# Patient Record
Sex: Female | Born: 1975 | Race: White | Hispanic: No | Marital: Married | Smoking: Never smoker
Health system: Southern US, Community
[De-identification: ages and names within clinical notes are randomized; demographics above are authoritative.]

## PROBLEM LIST (undated history)

## (undated) ENCOUNTER — Emergency Department (HOSPITAL_COMMUNITY): Admission: EM | Payer: BC Managed Care – PPO | Source: Home / Self Care

## (undated) DIAGNOSIS — R740 Nonspecific elevation of levels of transaminase and lactic acid dehydrogenase [LDH]: Secondary | ICD-10-CM

## (undated) DIAGNOSIS — R7401 Elevation of levels of liver transaminase levels: Secondary | ICD-10-CM

## (undated) DIAGNOSIS — E559 Vitamin D deficiency, unspecified: Secondary | ICD-10-CM

## (undated) DIAGNOSIS — R7402 Elevation of levels of lactic acid dehydrogenase (LDH): Secondary | ICD-10-CM

## (undated) DIAGNOSIS — E663 Overweight: Secondary | ICD-10-CM

## (undated) DIAGNOSIS — F419 Anxiety disorder, unspecified: Secondary | ICD-10-CM

## (undated) DIAGNOSIS — G8929 Other chronic pain: Secondary | ICD-10-CM

## (undated) DIAGNOSIS — L719 Rosacea, unspecified: Secondary | ICD-10-CM

## (undated) DIAGNOSIS — K760 Fatty (change of) liver, not elsewhere classified: Secondary | ICD-10-CM

## (undated) HISTORY — DX: Vitamin D deficiency, unspecified: E55.9

## (undated) HISTORY — DX: Rosacea, unspecified: L71.9

## (undated) HISTORY — DX: Anxiety disorder, unspecified: F41.9

## (undated) HISTORY — DX: Overweight: E66.3

## (undated) HISTORY — DX: Nonspecific elevation of levels of transaminase and lactic acid dehydrogenase (ldh): R74.0

## (undated) HISTORY — DX: Fatty (change of) liver, not elsewhere classified: K76.0

## (undated) HISTORY — DX: Elevation of levels of lactic acid dehydrogenase (LDH): R74.02

## (undated) HISTORY — DX: Other chronic pain: G89.29

## (undated) HISTORY — PX: WISDOM TOOTH EXTRACTION: SHX21

## (undated) HISTORY — DX: Elevation of levels of liver transaminase levels: R74.01

## (undated) HISTORY — PX: CHOLECYSTECTOMY: SHX55

---

## 1998-10-26 ENCOUNTER — Other Ambulatory Visit: Admission: RE | Admit: 1998-10-26 | Discharge: 1998-10-26 | Payer: Self-pay | Admitting: Gynecology

## 2007-10-14 ENCOUNTER — Ambulatory Visit (HOSPITAL_BASED_OUTPATIENT_CLINIC_OR_DEPARTMENT_OTHER): Admission: RE | Admit: 2007-10-14 | Discharge: 2007-10-14 | Payer: Self-pay | Admitting: Family Medicine

## 2010-02-18 ENCOUNTER — Ambulatory Visit: Payer: Self-pay | Admitting: Family Medicine

## 2010-02-18 DIAGNOSIS — J029 Acute pharyngitis, unspecified: Secondary | ICD-10-CM | POA: Insufficient documentation

## 2010-02-18 DIAGNOSIS — J069 Acute upper respiratory infection, unspecified: Secondary | ICD-10-CM | POA: Insufficient documentation

## 2010-02-18 DIAGNOSIS — F329 Major depressive disorder, single episode, unspecified: Secondary | ICD-10-CM | POA: Insufficient documentation

## 2010-02-20 ENCOUNTER — Telehealth (INDEPENDENT_AMBULATORY_CARE_PROVIDER_SITE_OTHER): Payer: Self-pay | Admitting: *Deleted

## 2010-05-22 NOTE — Progress Notes (Signed)
  Phone Note Outgoing Call Call back at [facility] Phone [phone]   Call placed by: Lajean Saver RN,  February 20, 2010 2:48 PM Call placed to: Patient Summary of Call: Callback: No answer, message left on patient's personal cell phone with reason for call and negative throat culture result. Call back with any questions.

## 2010-05-22 NOTE — Assessment & Plan Note (Signed)
Summary: runny nose/cough-drk brown-yellowis, HA x 1 wk rm 3   Vital Signs:  Patient Profile:   35 Years Old Female CC:      Cold & URI symptoms Height:     69 inches Weight:      170 pounds O2 Sat:      100 % O2 treatment:    Room Air Temp:     97.9 degrees F oral Pulse rate:   83 / minute Pulse rhythm:   regular Resp:     16 per minute BP sitting:   116 / 76  (left arm) Cuff size:   regular  Vitals Entered By: Areta Haber CMA (February 18, 2010 4:57 PM)                  Current Allergies: No known allergies History of Present Illness Chief Complaint: Cold & URI symptoms History of Present Illness:  Subjective: Patient complains of mild sore throat and nasal congestion for about 6 days.  Her two children have had similar symptoms.  Minimal cough No pleuritic pain No wheezing ? post-nasal drainage No sinus pain/pressure No itchy/red eyes No earache but ears feel clogged No hemoptysis No SOB No fever/chills No nausea No vomiting No abdominal pain No diarrhea No skin rashes + fatigue No myalgias + headache Used OTC meds without relief   Current Problems: ACUTE PHARYNGITIS (ICD-462) UPPER RESPIRATORY INFECTION, VIRAL (ICD-465.9) FAMILY HISTORY DIABETES 1ST DEGREE RELATIVE (ICD-V18.0) FAMILY HISTORY OF COLON CA 1ST DEGREE RELATIVE <60 (ICD-V16.0) FAMILY HISTORY BREAST CANCER 1ST DEGREE RELATIVE <50 (ICD-V16.3) DEPRESSION (ICD-311)   Current Meds NYQUIL 60-7.09-18-998 MG/30ML LIQD (PSEUDOEPH-DOXYLAMINE-DM-APAP) as directed DAYQUIL MULTI-SYMPTOM 30-325-10 MG/15ML LIQD (PSEUDOEPHEDRINE-APAP-DM) as directed CYMBALTA 30 MG CPEP (DULOXETINE HCL) 1 tab by mouth once daily AZITHROMYCIN 250 MG TABS (AZITHROMYCIN) Two tabs by mouth on day 1, then 1 tab daily on days 2 through 5 (Rx void after 02/27/10)  REVIEW OF SYSTEMS Constitutional Symptoms      Denies fever, chills, night sweats, weight loss, weight gain, and fatigue.  Eyes       Denies change in  vision, eye pain, eye discharge, glasses, contact lenses, and eye surgery. Ear/Nose/Throat/Mouth       Complains of frequent runny nose and sinus problems.      Denies hearing loss/aids, change in hearing, ear pain, ear discharge, dizziness, frequent nose bleeds, sore throat, hoarseness, and tooth pain or bleeding.      Comments: dark brown-yellowish x 1wk Respiratory       Complains of productive cough.      Denies dry cough, wheezing, shortness of breath, asthma, bronchitis, and emphysema/COPD.  Cardiovascular       Denies murmurs, chest pain, and tires easily with exhertion.    Gastrointestinal       Denies stomach pain, nausea/vomiting, diarrhea, constipation, blood in bowel movements, and indigestion. Genitourniary       Denies painful urination, kidney stones, and loss of urinary control. Neurological       Complains of headaches.      Denies paralysis, seizures, and fainting/blackouts. Musculoskeletal       Denies muscle pain, joint pain, joint stiffness, decreased range of motion, redness, swelling, muscle weakness, and gout.  Skin       Denies bruising, unusual mles/lumps or sores, and hair/skin or nail changes.  Psych       Denies mood changes, temper/anger issues, anxiety/stress, speech problems, depression, and sleep problems. Other Comments: cough-drk brown-yellowish. Pt has not seen her PCP  for this.   Past History:  Social History: Last updated: 02/18/2010 Never Smoked Alcohol use-no Drug use-no Married Regular exercise-yes  Risk Factors: Smoking Status: never (02/18/2010)  Past Medical History: Depression  Past Surgical History: Caesarean section Cholecystectomy  Family History: Family History Breast cancer 1st degree relative <50 Family History of Colon CA 1st degree relative <60 Family History Diabetes 1st degree relative  Social History: Never Smoked Alcohol use-no Drug use-no Married Regular exercise-yes Smoking Status:  never Drug Use:   no Does Patient Exercise:  yes   Objective:  Appearance:  Patient appears healthy, stated age, and in no acute distress  Ears:  Canals normal.  Tympanic membranes normal.   Eyes:  Pupils are equal, round, and reactive to light and accomdation.  Extraocular movement is intact.  Conjunctivae are not inflamed.  Nose:  Normal septum.  Normal turbinates, mildly congested.   No sinus tenderness present.  Pharynx:  Normal  Neck:  Supple.  Slightly tender shotty anterior/posterior nodes are palpated bilaterally.  Lungs:  Clear to auscultation.  Breath sounds are equal.  Heart:  Regular rate and rhythm without murmurs, rubs, or gallops.  Abdomen:  Nontender without masses or hepatosplenomegaly.  Bowel sounds are present.  No CVA or flank tenderness.  Extremities:  No edema.  Skin:  No rash Rapid strep test negative  Assessment New Problems: ACUTE PHARYNGITIS (ICD-462) UPPER RESPIRATORY INFECTION, VIRAL (ICD-465.9) FAMILY HISTORY DIABETES 1ST DEGREE RELATIVE (ICD-V18.0) FAMILY HISTORY OF COLON CA 1ST DEGREE RELATIVE <60 (ICD-V16.0) FAMILY HISTORY BREAST CANCER 1ST DEGREE RELATIVE <50 (ICD-V16.3) DEPRESSION (ICD-311)   Plan New Medications/Changes: AZITHROMYCIN 250 MG TABS (AZITHROMYCIN) Two tabs by mouth on day 1, then 1 tab daily on days 2 through 5 (Rx void after 02/27/10)  #6 tabs x 0, 02/18/2010, Donna Christen MD  New Orders: Rapid Strep [[id number]] T-Culture, Throat [[id number]] New Patient Level III [24401]  The patient and/or caregiver has been counseled thoroughly with regard to medications prescribed including dosage, schedule, interactions, rationale for use, and possible side effects and they verbalize understanding.  Diagnoses and expected course of recovery discussed and will return if not improved as expected or if the condition worsens. Patient and/or caregiver verbalized understanding.  Prescriptions: AZITHROMYCIN 250 MG TABS (AZITHROMYCIN) Two tabs by mouth on day 1, then 1  tab daily on days 2 through 5 (Rx void after 02/27/10)  #6 tabs x 0   Entered and Authorized by:   Donna Christen MD   Signed by:   Donna Christen MD on 02/18/2010   Method used:   Print then Give to Patient   RxID:   [id number]   Patient Instructions: 1)  May use Mucinex D (guaifenesin with decongestant) twice daily for congestion.  Stop other OTC medications  2)  Increase fluid intake, rest. 3)  May use Afrin nasal spray (or generic oxymetazoline) twice daily for about 5 days.  Also recommend using saline nasal spray several times daily and/or saline nasal irrigation. 4)  If not improving about one week or if persistent fever develops, begin azithromycin. 5)  If cough becomes worse at night, may take Delsym cough suppressant at bedtime. 6)  Followup with family doctor if not improving 10 to 14 days.  Orders Added: 1)  Rapid Strep [59563] 2)  T-Culture, Throat [[id number]] 3)  New Patient Level III [99203]    Laboratory Results  Date/Time Received: February 18, 2010 5:40 PM  Date/Time Reported: February 18, 2010 5:40 PM   Other Tests  Rapid Strep:  negative  Kit Test Internal QC: Negative   (Normal Range: Negative)

## 2010-12-25 ENCOUNTER — Inpatient Hospital Stay (INDEPENDENT_AMBULATORY_CARE_PROVIDER_SITE_OTHER)
Admission: RE | Admit: 2010-12-25 | Discharge: 2010-12-25 | Disposition: A | Discharge status: Home / Self Care | Payer: BC Managed Care – PPO | Source: Ambulatory Visit | Attending: Emergency Medicine | Admitting: Emergency Medicine

## 2010-12-25 ENCOUNTER — Encounter: Payer: Self-pay | Admitting: Emergency Medicine

## 2010-12-25 DIAGNOSIS — J069 Acute upper respiratory infection, unspecified: Secondary | ICD-10-CM

## 2010-12-25 LAB — CONVERTED CEMR LAB: Rapid Strep: NEGATIVE

## 2010-12-28 ENCOUNTER — Telehealth (INDEPENDENT_AMBULATORY_CARE_PROVIDER_SITE_OTHER): Payer: Self-pay | Admitting: Emergency Medicine

## 2011-03-25 NOTE — Progress Notes (Signed)
Summary: POSS STREP THROAT...WSE (room 5)   Vital Signs:  Patient Profile:   35 Years Old Female CC:      sore throat Height:     69 inches Weight:      187 pounds O2 Sat:      100 % O2 treatment:    Room Air Temp:     98.7 degrees F oral Pulse rate:   75 / minute Resp:     16 per minute BP sitting:   118 / 82  (left arm) Cuff size:   regular  Pt. in pain?   yes    Location:   throat  Vitals Entered By: Lavell Islam RN (December 25, 2010 6:19 PM)                   Updated Prior Medication List: FLEXERIL 10 MG TABS (CYCLOBENZAPRINE HCL) as needed BACTRIM 400-80 MG TABS (SULFAMETHOXAZOLE-TRIMETHOPRIM) should be on for rosacea, but not currently  Current Allergies: No known allergies History of Present Illness Chief Complaint: sore throat History of Present Illness: 35 Years Old Female complains of onset of cold symptoms for a few days.  Her daughter has strep throat and wants to make sure she doesn't have it.  Her symptoms are fairly mild, but her husband has also been sick for a while.  They moved to a new house and everybody has had URI symptoms since. +/- sore throat No cough No pleuritic pain No wheezing No nasal congestion No post-nasal drainage No sinus pain/pressure No chest congestion No itchy/red eyes No earache No hemoptysis No SOB No chills/sweats No fever No nausea No vomiting No abdominal pain No diarrhea No skin rashes No fatigue No myalgias + headache   REVIEW OF SYSTEMS Constitutional Symptoms       Complains of fever.     Denies chills, night sweats, weight loss, weight gain, and fatigue.  Eyes       Denies change in vision, eye pain, eye discharge, glasses, contact lenses, and eye surgery. Ear/Nose/Throat/Mouth       Complains of sore throat.      Denies hearing loss/aids, change in hearing, ear pain, ear discharge, dizziness, frequent runny nose, frequent nose bleeds, sinus problems, hoarseness, and tooth pain or bleeding.    Respiratory       Denies dry cough, productive cough, wheezing, shortness of breath, asthma, bronchitis, and emphysema/COPD.  Cardiovascular       Denies murmurs, chest pain, and tires easily with exhertion.    Gastrointestinal       Denies stomach pain, nausea/vomiting, diarrhea, constipation, blood in bowel movements, and indigestion. Genitourniary       Denies painful urination, kidney stones, and loss of urinary control. Neurological       Complains of headaches.      Denies paralysis, seizures, and fainting/blackouts. Musculoskeletal       Denies muscle pain, joint pain, joint stiffness, decreased range of motion, redness, swelling, muscle weakness, and gout.  Skin       Denies bruising, unusual mles/lumps or sores, and hair/skin or nail changes.  Psych       Denies mood changes, temper/anger issues, anxiety/stress, speech problems, depression, and sleep problems. Other Comments: sore throat   Past History:  Past Medical History: rosacea neck problems  Past Surgical History: Reviewed history from 02/18/2010 and no changes required. Caesarean section Cholecystectomy  Family History: Reviewed history from 02/18/2010 and no changes required. Family History Breast cancer 1st degree relative <  50 Family History of Colon CA 1st degree relative <60 Family History Diabetes 1st degree relative  Social History: Reviewed history from 02/18/2010 and no changes required. Never Smoked Alcohol use-no Drug use-no Married Regular exercise-yes Physical Exam General appearance: well developed, well nourished, no acute distress Ears: normal, no lesions or deformities Nasal: mucosa pink, nonedematous, no septal deviation, turbinates normal Oral/Pharynx: tongue normal, posterior pharynx without erythema or exudate Chest/Lungs: no rales, wheezes, or rhonchi bilateral, breath sounds equal without effort Heart: regular rate and  rhythm, no murmur MSE: oriented to time, place, and  person Assessment New Problems: UPPER RESPIRATORY INFECTION, ACUTE (ICD-465.9)   Plan New Orders: Est. Patient Level III [99213] T-Culture, Throat [[id number]] Rapid Strep [09811] Planning Comments:   Rapid strep negative.  Culture pending.  This is likely URI, perhaps with allergic component (new house -- carpets, etc).  Hydration, rest, good hand hygeine.  OTC cough & cold meds as needed. Follow-up with your primary care physician if not improving or if getting worse   The patient and/or caregiver has been counseled thoroughly with regard to medications prescribed including dosage, schedule, interactions, rationale for use, and possible side effects and they verbalize understanding.  Diagnoses and expected course of recovery discussed and will return if not improved as expected or if the condition worsens. Patient and/or caregiver verbalized understanding.   Orders Added: 1)  Est. Patient Level III [99213] 2)  T-Culture, Throat [[id number]] 3)  Rapid Strep [13086]    Laboratory Results  Date/Time Received: December 25, 2010 6:26 PM  Date/Time Reported: December 25, 2010 6:26 PM   Other Tests  Rapid Strep: negative  Kit Test Internal QC: Negative   (Normal Range: Negative)

## 2011-03-25 NOTE — Telephone Encounter (Signed)
  Phone Note Outgoing Call   Call placed by: Lavell Islam RN,  December 28, 2010 1:07 PM Call placed to: Patient Summary of Call: Spoke with patient who states she is feeling better. Told her culture negative. She has 2 children ill. Initial call taken by: Lavell Islam RN,  December 28, 2010 1:09 PM

## 2011-07-01 ENCOUNTER — Encounter: Payer: Self-pay | Admitting: Emergency Medicine

## 2011-07-01 ENCOUNTER — Emergency Department
Admission: EM | Admit: 2011-07-01 | Discharge: 2011-07-01 | Disposition: A | Discharge status: Home / Self Care | Payer: BC Managed Care – PPO | Source: Home / Self Care | Attending: Family Medicine | Admitting: Family Medicine

## 2011-07-01 DIAGNOSIS — J069 Acute upper respiratory infection, unspecified: Secondary | ICD-10-CM

## 2011-07-01 MED ORDER — BENZONATATE 200 MG PO CAPS: 200.0000 mg | ORAL_CAPSULE | Freq: Every day | ORAL | Status: AC

## 2011-07-01 MED ORDER — AZITHROMYCIN 250 MG PO TABS: ORAL_TABLET | ORAL | Status: AC

## 2011-07-01 NOTE — ED Provider Notes (Signed)
History     CSN: [id number]  Arrival date & time 07/01/11  1302   First MD Initiated Contact with Patient 07/01/11 1322      Chief Complaint  Patient presents with  . Cough      HPI Comments: Patient complains of approximately 3 day history of gradually progressive URI symptoms beginning with a mild sore throat (now improved), followed by progressive nasal congestion.  A cough started next. Complains of fatigue and initial myalgias.  Cough is now worse at night and generally non-productive during the day.  There has been no pleuritic pain but she complains of tightness in her anterior chest.  She has had mild wheezing but no shortness of breath.  She sometimes coughs until she gags.  She does not remember her last tetanus shot.  The history is provided by the patient.    No pertinent past medical history  Past Surgical History  Procedure Date  . Cesarean section   . Cholecystectomy     No pertinent family history   History  Substance Use Topics  . Smoking status: Never Smoker   . Smokeless tobacco: Not on file  . Alcohol Use: No    OB History    Grav Para Term Preterm Abortions TAB SAB Ect Mult Living                  Review of Systems + sore throat + cough No pleuritic pain but complains of tightness in anterior chest. ? wheezing + nasal congestion + post-nasal drainage No sinus pain/pressure No itchy/red eyes No earache No hemoptysis No SOB + low grade fever, + chills No nausea No vomiting No abdominal pain No diarrhea No urinary symptoms No skin rashes + fatigue + myalgias + headache Used OTC meds without relief  Allergies  Review of patient's allergies indicates no known allergies.  Home Medications   Current Outpatient Rx  Name Route Sig Dispense Refill  . MINOCYCLINE HCL 100 MG PO CAPS Oral Take 100 mg by mouth 2 (two) times daily.    . AZITHROMYCIN 250 MG PO TABS  Take 2 tabs today; then begin one tab once daily for 4 more days. 6 each 0   . BENZONATATE 200 MG PO CAPS Oral Take 1 capsule (200 mg total) by mouth at bedtime. Take as needed for cough 12 capsule 0    BP 115/81  Pulse 105  Temp(Src) 98.8 F (37.1 C) (Oral)  Resp 18  Ht 5\' 9"  (1.753 m)  Wt 183 lb (83.008 kg)  BMI 27.02 kg/m2  SpO2 98%  LMP 06/09/2011  Physical Exam Nursing notes and Vital Signs reviewed. Appearance:  Patient appears healthy, stated age, and in no acute distress Eyes:  Pupils are equal, round, and reactive to light and accomodation.  Extraocular movement is intact.  Conjunctivae are not inflamed  Ears:  Canals normal.  Tympanic membranes normal.  Nose:  Mildly congested turbinates.  No sinus tenderness.   Pharynx:  Normal Neck:  Supple.  Slightly tender shotty anterior/posterior nodes are palpated bilaterally  Lungs:  Clear to auscultation.  Breath sounds are equal.  Chest:   Mild tenderness to palpation over the mid-sternum.  Heart:  Regular rate and rhythm without murmurs, rubs, or gallops.  Abdomen:  Nontender without masses or hepatosplenomegaly.  Bowel sounds are present.  No CVA or flank tenderness.  Extremities:  No edema.  No calf tenderness Skin:  No rash present.   ED Course  Procedures none  1. Acute upper respiratory infections of unspecified site       MDM  ?Pertussis Begin Z-pack, and Tessalon at bedtime Take Mucinex D (guaifenesin with decongestant) twice daily for congestion.  Increase fluid intake, rest. May use Afrin nasal spray (or generic oxymetazoline) twice daily for about 5 days.  Also recommend using saline nasal spray several times daily and saline nasal irrigation (AYR is a common brand) Stop all antihistamines for now, and other non-prescription cough/cold preparations. May take Ibuprofen 200mg , 4 tabs every 8 hours with food for chest/sternum discomfort. Recommend a Tdap when well.  Follow-up with family doctor if not improving one week.        Lattie Haw, MD 07/01/11 1406

## 2011-07-01 NOTE — Discharge Instructions (Signed)
Take Mucinex D (guaifenesin with decongestant) twice daily for congestion.  Increase fluid intake, rest. May use Afrin nasal spray (or generic oxymetazoline) twice daily for about 5 days.  Also recommend using saline nasal spray several times daily and saline nasal irrigation (AYR is a common brand) Stop all antihistamines for now, and other non-prescription cough/cold preparations. May take Ibuprofen 200mg, 4 tabs every 8 hours with food for chest/sternum discomfort. Recommend a Tdap when well.  Follow-up with family doctor if not improving one week. 

## 2011-07-01 NOTE — ED Notes (Signed)
Cough, thick, dark mucus. Low grade fever Headache, fatigue x 3 days

## 2011-07-05 ENCOUNTER — Telehealth: Payer: Self-pay | Admitting: *Deleted

## 2011-07-05 MED ORDER — GUAIFENESIN-CODEINE 100-10 MG/5ML PO SYRP: 10.0000 mL | ORAL_SOLUTION | Freq: Every day | ORAL | Status: DC

## 2011-07-05 MED ORDER — GUAIFENESIN-CODEINE 100-10 MG/5ML PO SYRP: 10.0000 mL | ORAL_SOLUTION | Freq: Every day | ORAL | Status: AC

## 2011-07-05 MED ORDER — PREDNISONE 10 MG PO TABS: ORAL_TABLET | ORAL | Status: DC

## 2011-07-05 NOTE — ED Notes (Signed)
Suspect a post-infectious cough. Will begin tapering course of prednisone.  Add Cherratussin at bedtime.  Followup with Family Doctor if not improved in 3 days.  Lattie Haw, MD 07/05/11 [phone]

## 2011-07-06 ENCOUNTER — Telehealth: Payer: Self-pay | Admitting: Family Medicine

## 2011-10-28 ENCOUNTER — Emergency Department
Admission: EM | Admit: 2011-10-28 | Discharge: 2011-10-28 | Disposition: A | Discharge status: Home / Self Care | Payer: BC Managed Care – PPO | Source: Home / Self Care | Attending: Emergency Medicine | Admitting: Emergency Medicine

## 2011-10-28 DIAGNOSIS — L03019 Cellulitis of unspecified finger: Secondary | ICD-10-CM

## 2011-10-28 DIAGNOSIS — IMO0001 Reserved for inherently not codable concepts without codable children: Secondary | ICD-10-CM

## 2011-10-28 MED ORDER — HYDROCODONE-ACETAMINOPHEN 5-500 MG PO TABS: ORAL_TABLET | ORAL | Status: AC

## 2011-10-28 MED ORDER — DOXYCYCLINE HYCLATE 100 MG PO CAPS: 100.0000 mg | ORAL_CAPSULE | Freq: Two times a day (BID) | ORAL | Status: AC

## 2011-10-28 NOTE — ED Notes (Signed)
Yolanda Cooper complains of swelling and redness around her nail on her 4 th digit left hand for 2 days. She describes the pain as throbbing. Today she states the finger tip is warm. She has applied ice to area with little relief.

## 2011-10-28 NOTE — ED Provider Notes (Signed)
History    HPI: @ATITLE @  @PATIENTLASTNAME @ presents for evaluation of a cutaneous abscess. The lesion is located in the left fourth finger tip, lateral to fingernail.. Onset was 2 days ago. She feels it started by her chewing on her fingernail. Symptoms have gradually worsened. Abscess has associated symptoms of minimal yellow spontaneous drainage. She  does not have previous history of cutaneous abscesses. There is not a previous history of MRSA.  She does not have a history of diabetes.  ROS:  Systemic:  No fevers, chills or sweats. Skin:  No rash or other skin lesions.  PE:  General:  Alert, active, in no distress. Skin:  There was a 1 cm, red, hot, tender, mostly firm, but slightly fluctulent mass on the left fourth finger, just lateral to the fingernail. Fingernail otherwise intact. Neurovascular intact..  No other skin rash or skin lesions.   IMP:  Paronychia left fourth finger   Procedure: Incision and Drainage The procedure, risks and complications have been discussed in detail (including, but not limited to airway compromise, infection, bleeding) with the patient, and the patient has given verbal consent to the procedure.  The skin was prepped with Betadine and alcohol. After adequate digital block, left fourth finger, anesthesia with 4 cc of 2% plain lidocaine), I&D with a #11 blade was performed on the abscess as described in physical exam section. Bloody, slight purulent drainage: present.   Wound culture was sent. A sterile pressure dressing was applied. The patient tolerated the procedure well. She was given aftercare instructions and started on antibiotics as listed below.    CSN: [id number]  Arrival date & time 10/28/11  0804   First MD Initiated Contact with Patient 10/28/11 0815      Chief Complaint  Patient presents with  . Nail Problem    x 2 days    HPI  History reviewed. No pertinent past medical history.  Past Surgical History  Procedure Date  . Cesarean  section   . Cholecystectomy   . Wisdom tooth extraction     Family History  Problem Relation Age of Onset  . Diabetes Father   . Hyperlipidemia Father   . Hypertension Father   . Cancer Other     colon cancer   . Anuerysm Other     History  Substance Use Topics  . Smoking status: Never Smoker   . Smokeless tobacco: Never Used  . Alcohol Use: No    OB History    Grav Para Term Preterm Abortions TAB SAB Ect Mult Living                  Review of Systems  Allergies  Review of patient's allergies indicates no known allergies.  Home Medications   Current Outpatient Rx  Name Route Sig Dispense Refill  . DOXYCYCLINE HYCLATE 100 MG PO CAPS Oral Take 1 capsule (100 mg total) by mouth 2 (two) times daily. 20 capsule 0  . HYDROCODONE-ACETAMINOPHEN 5-500 MG PO TABS  Take 1 or 2 every 4-6 hours as needed for severe pain 6 tablet 0    BP 116/78  Pulse 77  Temp 97.9 F (36.6 C) (Oral)  Resp 17  Ht 5\' 9"  (1.753 m)  Wt 190 lb (86.183 kg)  BMI 28.06 kg/m2  SpO2 98%  LMP 10/21/2011  Physical Exam  ED Course  Procedures (including critical care time)   Labs Reviewed  WOUND CULTURE    1. Paronychia of fourth finger of left hand  MDM  See above for history, physical exam, procedure, assessment and plans. I&D performed. Doxycycline prescribed .( I offered to start tx with IM Rocephin, but she declined that option.) See detailed Instructions in AVS, which were given to patient. Verbal instructions also given. Risks, benefits, and alternatives of treatment options discussed. Questions invited and answered. Patient voiced understanding and agreement with plans.        Lajean Manes, MD 10/28/11 [phone]

## 2011-10-31 ENCOUNTER — Telehealth: Payer: Self-pay

## 2011-10-31 LAB — WOUND CULTURE
Gram Stain: NONE SEEN
Gram Stain: NONE SEEN

## 2011-11-01 ENCOUNTER — Telehealth: Payer: Self-pay

## 2011-11-01 NOTE — ED Notes (Signed)
Patient returned my call and I advised her she does have an infection in her finger and to finish all the antibiotic. I also advised her to call back if she has concerns or questions.

## 2012-10-07 ENCOUNTER — Other Ambulatory Visit: Payer: Self-pay | Admitting: *Deleted

## 2012-10-07 DIAGNOSIS — Z0000 Encounter for general adult medical examination without abnormal findings: Secondary | ICD-10-CM

## 2012-10-07 DIAGNOSIS — E559 Vitamin D deficiency, unspecified: Secondary | ICD-10-CM

## 2012-10-08 ENCOUNTER — Other Ambulatory Visit: Payer: Self-pay | Admitting: Family Medicine

## 2012-10-08 ENCOUNTER — Other Ambulatory Visit: Payer: BC Managed Care – PPO

## 2012-10-08 ENCOUNTER — Other Ambulatory Visit: Payer: Self-pay | Admitting: *Deleted

## 2012-10-08 LAB — HEPATIC FUNCTION PANEL
ALT: 130 U/L — ABNORMAL HIGH (ref 0–53)
AST: 74 U/L — ABNORMAL HIGH (ref 0–37)
Albumin: 4.1 g/dL (ref 3.5–5.2)
Alkaline Phosphatase: 63 U/L (ref 39–117)
Bilirubin, Direct: 0.1 mg/dL (ref 0.0–0.3)
Indirect Bilirubin: 0.2 mg/dL (ref 0.0–0.9)
Total Bilirubin: 0.3 mg/dL (ref 0.3–1.2)
Total Protein: 7.3 g/dL (ref 6.0–8.3)

## 2012-10-09 LAB — VITAMIN D 25 HYDROXY (VIT D DEFICIENCY, FRACTURES): Vit D, 25-Hydroxy: 36 ng/mL (ref 30–89)

## 2012-10-13 ENCOUNTER — Ambulatory Visit: Payer: BC Managed Care – PPO | Admitting: Family Medicine

## 2012-10-22 ENCOUNTER — Encounter: Payer: Self-pay | Admitting: Family Medicine

## 2012-10-22 ENCOUNTER — Ambulatory Visit (INDEPENDENT_AMBULATORY_CARE_PROVIDER_SITE_OTHER): Payer: BC Managed Care – PPO | Admitting: Family Medicine

## 2012-10-22 ENCOUNTER — Ambulatory Visit: Payer: BC Managed Care – PPO | Admitting: Family Medicine

## 2012-10-22 ENCOUNTER — Telehealth: Payer: Self-pay

## 2012-10-22 VITALS — BP 106/74 | HR 76 | Wt 186.0 lb

## 2012-10-22 DIAGNOSIS — R7401 Elevation of levels of liver transaminase levels: Secondary | ICD-10-CM

## 2012-10-22 NOTE — Telephone Encounter (Signed)
The patient has been informed of appointment for U/S on Tuesday July 8th at 9:30 am.

## 2012-10-22 NOTE — Progress Notes (Signed)
  Subjective:    Patient ID: Yolanda Cooper) [place], female    DOB: [birth year], 37 y.o.   MRN: [id number]  HPI  Yolanda Cooper is here today to go over her lab results and to discuss the conditions listed below:   1)  Migraine Headaches:  She was having more migraine headaches and was recently changed to another birth control pill which she says has helped her.      2)  Mood:  She stopped taking her Cymbalta.  She decided to make a few changes in her life to reduce her stress.    3)  Rosacea:  She took the Doxycycline for a brief time but has stopped taking it.    4)  Elevated Liver Enzymes:  She has not really done the things that were recommended for her like Vitamin E or a low fat diet.    Review of Systems  Constitutional: Negative for fatigue and unexpected weight change.  Gastrointestinal: Negative for abdominal pain and abdominal distention.  Neurological: Positive for headaches (They have improved.).    Past Medical History  Diagnosis Date  . Rosacea   . Anxiety   . Unspecified vitamin D deficiency   . Overweight   . Other chronic pain   . Nonspecific elevation of levels of transaminase and lactic acid dehydrogenase (LDH)    Family History  Problem Relation Age of Onset  . Diabetes Father   . Hyperlipidemia Father   . Hypertension Father   . Cancer Other     colon cancer   . Anuerysm Other   . Diabetes Paternal Grandmother    History   Social History Narrative   Marital Status:  Married Pharmacist, hospital)    Children:  Son (Will)  Daughter (Emmie)   Pets:  None    Living Situation: Lives with spouse and children.    Occupation: Dance movement psychotherapist Barrister's clerk)    Education:  [employer]    Tobacco Use/Exposure:  None    Alcohol Use:  Occasional   Drug Use:  None   Diet:  Regular   Exercise:  None   Hobbies:  Watching TV                    Objective:   Physical Exam  Constitutional: She appears well-nourished. No distress.  HENT:  Head:  Normocephalic.  Eyes: No scleral icterus.  Neck: No thyromegaly present.  Cardiovascular: Normal rate, regular rhythm and normal heart sounds.   Pulmonary/Chest: Effort normal and breath sounds normal.  Abdominal: There is no tenderness.  Musculoskeletal: She exhibits no edema and no tenderness.  Neurological: She is alert.  Skin: Skin is warm and dry. There is erythema.  Her face is very dry.    Psychiatric: She has a normal mood and affect. Her behavior is normal. Judgment and thought content normal.          Assessment & Plan:

## 2012-10-22 NOTE — Patient Instructions (Addendum)
1)  Liver Enzymes  Checking liver with an ultrasound  Vitamin E 400 - 800 IU Milk Thistle  Low Fat Diet   Fatty Liver Fatty liver is the accumulation of fat in liver cells. It is also called hepatosteatosis or steatohepatitis. It is normal for your liver to contain some fat. If fat is more than 5 to 10% of your liver's weight, you have fatty liver.  There are often no symptoms (problems) for years while damage is still occurring. People often learn about their fatty liver when they have medical tests for other reasons. Fat can damage your liver for years or even decades without causing problems. When it becomes severe, it can cause fatigue, weight loss, weakness, and confusion. This makes you more likely to develop more serious liver problems. The liver is the largest organ in the body. It does a lot of work and often gives no warning signs when it is sick until late in a disease. The liver has many important jobs including:  Breaking down foods.  Storing vitamins, iron, and other minerals.  Making proteins.  Making bile for food digestion.  Breaking down many products including medications, alcohol and some poisons. CAUSES  There are a number of different conditions, medications, and poisons that can cause a fatty liver. Eating too many calories causes fat to build up in the liver. Not processing and breaking fats down normally may also cause this. Certain conditions, such as obesity, diabetes, and high triglycerides also cause this. Most fatty liver patients tend to be middle-aged and over weight.  Some causes of fatty liver are:  Alcohol over consumption.  Malnutrition.  Steroid use.  Valproic acid toxicity.  Obesity.  Cushing's syndrome.  Poisons.  Tetracycline in high dosages.  Pregnancy.  Diabetes.  Hyperlipidemia.  Rapid weight loss. Some people develop fatty liver even having none of these conditions. SYMPTOMS  Fatty liver most often causes no problems. This  is called asymptomatic.  It can be diagnosed with blood tests and also by a liver biopsy.  It is one of the most common causes of minor elevations of liver enzymes on routine blood tests.  Specialized Imaging of the liver using ultrasound, CT (computed tomography) scan, or MRI (magnetic resonance imaging) can suggest a fatty liver but a biopsy is needed to confirm it.  A biopsy involves taking a small sample of liver tissue. This is done by using a needle. It is then looked at under a microscope by a specialist. TREATMENT  It is important to treat the cause. Simple fatty liver without a medical reason may not need treatment.  Weight loss, fat restriction, and exercise in overweight patients produces inconsistent results but is worth trying.  Fatty liver due to alcohol toxicity may not improve even with stopping drinking.  Good control of diabetes may reduce fatty liver.  Lower your triglycerides through diet, medication or both.  Eat a balanced, healthy diet.  Increase your physical activity.  Get regular checkups from a liver specialist.  There are no medical or surgical treatments for a fatty liver or NASH, but improving your diet and increasing your exercise may help prevent or reverse some of the damage. PROGNOSIS  Fatty liver may cause no damage or it can lead to an inflammation of the liver. This is, called steatohepatitis. When it is linked to alcohol abuse, it is called alcoholic steatohepatitis. It often is not linked to alcohol. It is then called nonalcoholic steatohepatitis, or NASH. Over time the liver may  become scarred and hardened. This condition is called cirrhosis. Cirrhosis is serious and may lead to liver failure or cancer. NASH is one of the leading causes of cirrhosis. About 10-20% of Americans have fatty liver and a smaller 2-5% has NASH. Document Released: 05/24/2005 Document Revised: 07/01/2011 Document Reviewed: 07/17/2005 [facility] Patient Information 2014  [place], [place].

## 2012-10-27 ENCOUNTER — Ambulatory Visit (HOSPITAL_BASED_OUTPATIENT_CLINIC_OR_DEPARTMENT_OTHER)
Admission: RE | Admit: 2012-10-27 | Discharge: 2012-10-27 | Disposition: A | Discharge status: Home / Self Care | Payer: BC Managed Care – PPO | Source: Ambulatory Visit | Attending: Family Medicine | Admitting: Family Medicine

## 2012-10-27 ENCOUNTER — Other Ambulatory Visit: Payer: Self-pay | Admitting: Family Medicine

## 2012-10-27 ENCOUNTER — Other Ambulatory Visit: Payer: BC Managed Care – PPO

## 2012-10-27 ENCOUNTER — Ambulatory Visit (INDEPENDENT_AMBULATORY_CARE_PROVIDER_SITE_OTHER): Payer: BC Managed Care – PPO | Admitting: Family Medicine

## 2012-10-27 ENCOUNTER — Encounter: Payer: Self-pay | Admitting: Family Medicine

## 2012-10-27 VITALS — BP 122/79 | HR 83 | Wt 186.0 lb

## 2012-10-27 DIAGNOSIS — R7401 Elevation of levels of liver transaminase levels: Secondary | ICD-10-CM

## 2012-10-27 DIAGNOSIS — Z9089 Acquired absence of other organs: Secondary | ICD-10-CM | POA: Insufficient documentation

## 2012-10-27 DIAGNOSIS — K7689 Other specified diseases of liver: Secondary | ICD-10-CM | POA: Insufficient documentation

## 2012-10-27 DIAGNOSIS — R7989 Other specified abnormal findings of blood chemistry: Secondary | ICD-10-CM | POA: Insufficient documentation

## 2012-10-27 DIAGNOSIS — K760 Fatty (change of) liver, not elsewhere classified: Secondary | ICD-10-CM

## 2012-10-27 LAB — HEPATITIS B SURFACE ANTIGEN: Hepatitis B Surface Ag: NEGATIVE

## 2012-10-27 LAB — HEPATITIS C ANTIBODY: HCV Ab: NEGATIVE

## 2012-10-27 NOTE — Progress Notes (Signed)
  Subjective:    Patient ID: Yolanda Cooper, female    DOB: [birth year], 37 y.o.   MRN: [id number]  HPI  Yolanda Cooper is here today to go over her abdominal U/S result.    Review of Systems  Constitutional: Negative.   HENT: Negative.   Eyes: Negative.   Respiratory: Negative.   Cardiovascular: Negative.   Gastrointestinal: Negative.   Endocrine: Negative.   Genitourinary: Negative.   Musculoskeletal: Negative.   Skin: Negative.   Allergic/Immunologic: Negative.   Neurological: Negative.   Hematological: Negative.   Psychiatric/Behavioral: Negative.     Past Medical History  Diagnosis Date  . Rosacea   . Anxiety   . Unspecified vitamin D deficiency   . Overweight   . Other chronic pain   . Nonspecific elevation of levels of transaminase and lactic acid dehydrogenase (LDH)     Family History  Problem Relation Age of Onset  . Diabetes Father   . Hyperlipidemia Father   . Hypertension Father   . Cancer Other     colon cancer   . Anuerysm Other   . Diabetes Paternal Grandmother     History   Social History Narrative   Marital Status:  Married Pharmacist, hospital)    Children:  Son (Will)  Daughter (Emmie)   Pets:  None    Living Situation: Lives with spouse and children.    Occupation: Dance movement psychotherapist Barrister's clerk)    Education:  [employer] BS    Tobacco Use/Exposure:  None    Alcohol Use:  Occasional   Drug Use:  None   Diet:  Regular   Exercise:  None   Hobbies:  Watching TV                    Objective:   Physical Exam  Constitutional: She appears well-nourished. No distress.  HENT:  Head: Normocephalic.  Eyes: No scleral icterus.  Neck: No thyromegaly present.  Cardiovascular: Normal rate, regular rhythm and normal heart sounds.   Pulmonary/Chest: Effort normal and breath sounds normal.  Abdominal: There is no tenderness.  Musculoskeletal: She exhibits no edema and no tenderness.  Neurological: She is alert.  Skin: Skin is warm  and dry.  Psychiatric: She has a normal mood and affect. Her behavior is normal. Judgment and thought content normal.      Assessment & Plan:

## 2012-10-27 NOTE — Assessment & Plan Note (Signed)
We had a long talk about her elevated liver enzymes.  She has decided that she needs to get serious about this.  She was given a list of things to do.  We'll send her for a fatty liver and will do a hepatitis panel.

## 2012-10-27 NOTE — Progress Notes (Signed)
I attempted to call the patient to inform her of lab results. I was not able to leave a message for a callback at this time due to mailbox being full. Will attempt again tomorrow 10/28/2012. LB

## 2012-10-27 NOTE — Patient Instructions (Addendum)
1)  Low Fat Diet - Ornish vs Pritikin   Fat and Cholesterol Control Diet Cholesterol levels in your body are determined significantly by your diet. Cholesterol levels may also be related to heart disease. The following material helps to explain this relationship and discusses what you can do to help keep your heart healthy. Not all cholesterol is bad. Low-density lipoprotein (LDL) cholesterol is the "bad" cholesterol. It may cause fatty deposits to build up inside your arteries. High-density lipoprotein (HDL) cholesterol is "good." It helps to remove the "bad" LDL cholesterol from your blood. Cholesterol is a very important risk factor for heart disease. Other risk factors are high blood pressure, smoking, stress, heredity, and weight. The heart muscle gets its supply of blood through the coronary arteries. If your LDL cholesterol is high and your HDL cholesterol is low, you are at risk for having fatty deposits build up in your coronary arteries. This leaves less room through which blood can flow. Without sufficient blood and oxygen, the heart muscle cannot function properly and you may feel chest pains (angina pectoris). When a coronary artery closes up entirely, a part of the heart muscle may die causing a heart attack (myocardial infarction). CHECKING CHOLESTEROL When your caregiver sends your blood to a lab to be examined for cholesterol, a complete lipid (fat) profile may be done. With this test, the total amount of cholesterol and levels of LDL and HDL are determined. Triglycerides are a type of fat that circulates in the blood. They can also be used to determine heart disease risk. The list below describes what the numbers should be: Test: Total Cholesterol.  Less than 200 mg/dl. Test: LDL "bad cholesterol."  Less than 100 mg/dl.  Less than 70 mg/dl if you are at very high risk of a heart attack or sudden cardiac death. Test: HDL "good cholesterol."  Greater than 50 mg/dl for  women.  Greater than 40 mg/dl for men. Test: Triglycerides.  Less than 150 mg/dl. CONTROLLING CHOLESTEROL WITH DIET Although exercise and lifestyle factors are important, your diet is key. That is because certain foods are known to raise cholesterol and others to lower it. The goal is to balance foods for their effect on cholesterol and more importantly, to replace saturated and trans fat with other types of fat, such as monounsaturated fat, polyunsaturated fat, and omega-3 fatty acids. On average, a person should consume no more than 15 to 17 g of saturated fat daily. Saturated and trans fats are considered "bad" fats, and they will raise LDL cholesterol. Saturated fats are primarily found in animal products such as meats, butter, and cream. However, that does not mean you need to give up all your favorite foods. Today, there are good tasting, low-fat, low-cholesterol substitutes for most of the things you like to eat. Choose low-fat or nonfat alternatives. Choose round or loin cuts of red meat. These types of cuts are lowest in fat and cholesterol. Chicken (without the skin), fish, veal, and ground [place] breast are great choices. Eliminate fatty meats, such as hot dogs and salami. Even shellfish have little or no saturated fat. Have a 3 oz (85 g) portion when you eat lean meat, poultry, or fish. Trans fats are also called "partially hydrogenated oils." They are oils that have been scientifically manipulated so that they are solid at room temperature resulting in a longer shelf life and improved taste and texture of foods in which they are added. Trans fats are found in stick margarine, some tub margarines,  cookies, crackers, and baked goods.  When baking and cooking, oils are a great substitute for butter. The monounsaturated oils are especially beneficial since it is believed they lower LDL and raise HDL. The oils you should avoid entirely are saturated tropical oils, such as coconut and palm.   Remember to eat a lot from food groups that are naturally free of saturated and trans fat, including fish, fruit, vegetables, beans, grains (barley, rice, couscous, bulgur wheat), and pasta (without cream sauces).  IDENTIFYING FOODS THAT LOWER CHOLESTEROL  Soluble fiber may lower your cholesterol. This type of fiber is found in fruits such as apples, vegetables such as broccoli, potatoes, and carrots, legumes such as beans, peas, and lentils, and grains such as barley. Foods fortified with plant sterols (phytosterol) may also lower cholesterol. You should eat at least 2 g per day of these foods for a cholesterol lowering effect.  Read package labels to identify low-saturated fats, trans fat free, and low-fat foods at the supermarket. Select cheeses that have only 2 to 3 g saturated fat per ounce. Use a heart-healthy tub margarine that is free of trans fats or partially hydrogenated oil. When buying baked goods (cookies, crackers), avoid partially hydrogenated oils. Breads and muffins should be made from whole grains (whole-wheat or whole oat flour, instead of "flour" or "enriched flour"). Buy non-creamy canned soups with reduced salt and no added fats.  FOOD PREPARATION TECHNIQUES  Never deep-fry. If you must fry, either stir-fry, which uses very little fat, or use non-stick cooking sprays. When possible, broil, bake, or roast meats, and steam vegetables. Instead of putting butter or margarine on vegetables, use lemon and herbs, applesauce, and cinnamon (for squash and sweet potatoes), nonfat yogurt, salsa, and low-fat dressings for salads.  LOW-SATURATED FAT / LOW-FAT FOOD SUBSTITUTES Meats / Saturated Fat (g)  Avoid: Steak, marbled (3 oz/85 g) / 11 g  Choose: Steak, lean (3 oz/85 g) / 4 g  Avoid: Hamburger (3 oz/85 g) / 7 g  Choose: Hamburger, lean (3 oz/85 g) / 5 g  Avoid: Ham (3 oz/85 g) / 6 g  Choose: Ham, lean cut (3 oz/85 g) / 2.4 g  Avoid: Chicken, with skin, dark meat (3 oz/85 g) / 4  g  Choose: Chicken, skin removed, dark meat (3 oz/85 g) / 2 g  Avoid: Chicken, with skin, light meat (3 oz/85 g) / 2.5 g  Choose: Chicken, skin removed, light meat (3 oz/85 g) / 1 g Dairy / Saturated Fat (g)  Avoid: Whole milk (1 cup) / 5 g  Choose: Low-fat milk, 2% (1 cup) / 3 g  Choose: Low-fat milk, 1% (1 cup) / 1.5 g  Choose: Skim milk (1 cup) / 0.3 g  Avoid: Hard cheese (1 oz/28 g) / 6 g  Choose: Skim milk cheese (1 oz/28 g) / 2 to 3 g  Avoid: Cottage cheese, 4% fat (1 cup) / 6.5 g  Choose: Low-fat cottage cheese, 1% fat (1 cup) / 1.5 g  Avoid: Ice cream (1 cup) / 9 g  Choose: Sherbet (1 cup) / 2.5 g  Choose: Nonfat frozen yogurt (1 cup) / 0.3 g  Choose: Frozen fruit bar / trace  Avoid: Whipped cream (1 tbs) / 3.5 g  Choose: Nondairy whipped topping (1 tbs) / 1 g Condiments / Saturated Fat (g)  Avoid: Mayonnaise (1 tbs) / 2 g  Choose: Low-fat mayonnaise (1 tbs) / 1 g  Avoid: Butter (1 tbs) / 7 g  Choose: Extra light  margarine (1 tbs) / 1 g  Avoid: Coconut oil (1 tbs) / 11.8 g  Choose: Olive oil (1 tbs) / 1.8 g  Choose: Corn oil (1 tbs) / 1.7 g  Choose: Safflower oil (1 tbs) / 1.2 g  Choose: Sunflower oil (1 tbs) / 1.4 g  Choose: Soybean oil (1 tbs) / 2.4 g  Choose: Canola oil (1 tbs) / 1 g Document Released: 04/08/2005 Document Revised: 07/01/2011 Document Reviewed: 09/27/2010 [employer] Patient Information 2014 [place], [place].

## 2012-10-28 ENCOUNTER — Ambulatory Visit: Payer: BC Managed Care – PPO | Admitting: Family Medicine

## 2012-10-28 ENCOUNTER — Telehealth: Payer: Self-pay

## 2012-10-28 NOTE — Telephone Encounter (Signed)
I called the patient to give her the results of labs and verbalized understanding. A copy has been mailed to the patient at this time. LB

## 2012-11-05 ENCOUNTER — Ambulatory Visit: Payer: BC Managed Care – PPO | Admitting: Family Medicine

## 2012-11-29 DIAGNOSIS — K760 Fatty (change of) liver, not elsewhere classified: Secondary | ICD-10-CM | POA: Insufficient documentation

## 2012-11-29 NOTE — Assessment & Plan Note (Signed)
She is to follow a low fat diet to lower her LFTs and decrease the fatty infiltration of her liver.

## 2013-01-21 ENCOUNTER — Other Ambulatory Visit: Payer: Self-pay | Admitting: *Deleted

## 2013-01-21 DIAGNOSIS — R7401 Elevation of levels of liver transaminase levels: Secondary | ICD-10-CM

## 2013-01-22 ENCOUNTER — Other Ambulatory Visit: Payer: BC Managed Care – PPO

## 2013-01-22 LAB — ALT: ALT: 38 U/L — ABNORMAL HIGH (ref 0–35)

## 2013-01-22 LAB — AST: AST: 22 U/L (ref 0–37)

## 2013-01-26 ENCOUNTER — Encounter: Payer: Self-pay | Admitting: Family Medicine

## 2013-01-26 ENCOUNTER — Ambulatory Visit (INDEPENDENT_AMBULATORY_CARE_PROVIDER_SITE_OTHER): Payer: BC Managed Care – PPO | Admitting: Family Medicine

## 2013-01-26 VITALS — BP 113/77 | HR 73 | Resp 16 | Wt 178.0 lb

## 2013-01-26 DIAGNOSIS — K7689 Other specified diseases of liver: Secondary | ICD-10-CM

## 2013-01-26 DIAGNOSIS — K760 Fatty (change of) liver, not elsewhere classified: Secondary | ICD-10-CM

## 2013-01-26 DIAGNOSIS — Z23 Encounter for immunization: Secondary | ICD-10-CM

## 2013-01-26 DIAGNOSIS — R7401 Elevation of levels of liver transaminase levels: Secondary | ICD-10-CM

## 2013-01-26 NOTE — Progress Notes (Signed)
Subjective:    Patient ID: Yolanda Cooper, female    DOB: 1975-08-17, 37 y.o.   MRN: [id number]  HPI  Yolanda Cooper is here today to go over her most recent lab results and to discuss the conditions listed below:   1)  Abnormal Liver Enzyme:  She has been working very hard to protect her liver since her last visit.  She has been avoiding OTC medications and has been working very hard on improving her diet.  As a result, she has lost 8 lbs since her last office visit!      2)  Abdominal Pain:  She has been struggling with pain in her RLQ.  She has had this pain for about a week.  Her pain is moderate and it radiates to her right low back.  She has taken Advil when the pain is very intense which has helped her some.    Review of Systems  Constitutional: Negative.   HENT: Negative.   Eyes: Negative.   Respiratory: Negative.   Cardiovascular: Negative.   Gastrointestinal: Positive for abdominal pain.       RLQ  Endocrine: Negative.   Genitourinary: Negative.   Musculoskeletal: Negative.   Allergic/Immunologic: Negative.   Neurological: Negative.   Hematological: Negative.   Psychiatric/Behavioral: Negative.      Past Medical History  Diagnosis Date  . Rosacea   . Anxiety   . Unspecified vitamin D deficiency   . Overweight   . Other chronic pain   . Nonspecific elevation of levels of transaminase and lactic acid dehydrogenase (LDH)   . Fatty liver      Past Surgical History  Procedure Laterality Date  . Cesarean section    . Cholecystectomy    . Wisdom tooth extraction       History   Social History Narrative   Marital Status:  Married Pharmacist, hospital)    Children:  Son (Yolanda Cooper)  Daughter (Yolanda Cooper)   Pets:  None    Living Situation: Lives with spouse and children.    Occupation: Dance movement psychotherapist Barrister's clerk)    Education:  [employer] BS    Tobacco Use/Exposure:  None    Alcohol Use:  Occasional   Drug Use:  None   Diet:  Regular   Exercise:  None   Hobbies:  Watching TV      Family History  Problem Relation Age of Onset  . Diabetes Father   . Hyperlipidemia Father   . Hypertension Father   . Cancer Other     colon cancer   . Anuerysm Other   . Diabetes Paternal Grandmother      Current Outpatient Prescriptions on File Prior to Visit  Medication Sig Dispense Refill  . ibuprofen (ADVIL,MOTRIN) 200 MG tablet Take 400 mg by mouth every 8 (eight) hours as needed for headache (She has not taken very much since her last office visit.). PRN       No current facility-administered medications on file prior to visit.     No Known Allergies   Immunization History  Administered Date(s) Administered  . Influenza,inj,Quad PF,36+ Mos 01/26/2013  . MMR 03/07/2007  . Tdap 03/07/2007      Objective:   Physical Exam  Vitals reviewed. Constitutional: She is oriented to person, place, and time. She appears well-developed and well-nourished.  Cardiovascular: Normal rate and regular rhythm.   Pulmonary/Chest: Effort normal and breath sounds normal.  Neurological: She is alert and oriented to person, place, and time.  Psychiatric: She has  a normal mood and affect.      Assessment & Plan:    Yolanda Cooper was seen today for lab results.  Diagnoses and associated orders for this visit:  Elevated transaminase level Comments: Her ALT level decreased from 130 to 38 and her ALT went from 74 to 22.    Fatty liver disease, nonalcoholic Comments: She has done great with her weight loss.  She Yolanda Cooper continue to work on diet and exercise.    Need for prophylactic vaccination and inoculation against influenza - Flu Vaccine QUAD 36+ mos PF IM (Fluarix)

## 2013-03-22 DIAGNOSIS — Z23 Encounter for immunization: Secondary | ICD-10-CM | POA: Insufficient documentation

## 2013-03-22 NOTE — Assessment & Plan Note (Signed)
The patient confirmed that they are not allergic to eggs and have never had a bad reaction with the flu shot in the past.  The vaccination was given without difficulty.   

## 2013-04-04 ENCOUNTER — Encounter: Payer: Self-pay | Admitting: Family Medicine

## 2013-04-08 ENCOUNTER — Encounter: Payer: Self-pay | Admitting: Family Medicine

## 2013-04-08 ENCOUNTER — Ambulatory Visit (INDEPENDENT_AMBULATORY_CARE_PROVIDER_SITE_OTHER): Payer: BC Managed Care – PPO | Admitting: Family Medicine

## 2013-04-08 VITALS — Ht 69.0 in | Wt 181.4 lb

## 2013-04-08 DIAGNOSIS — K760 Fatty (change of) liver, not elsewhere classified: Secondary | ICD-10-CM

## 2013-04-08 DIAGNOSIS — K7689 Other specified diseases of liver: Secondary | ICD-10-CM

## 2013-04-08 DIAGNOSIS — E663 Overweight: Secondary | ICD-10-CM | POA: Insufficient documentation

## 2013-04-08 DIAGNOSIS — Z6825 Body mass index (BMI) 25.0-25.9, adult: Secondary | ICD-10-CM

## 2013-04-08 NOTE — Progress Notes (Signed)
Medical Nutrition Therapy:  Appt start time: 1330 end time:  1430.  Assessment:  Primary concerns today: Weight management and nonalcoholic fatty liver.  Yolanda Cooper was diagnosed last June with fatty liver, and was advised to lose weight for this.  She is the mother of a 6-YO boy and 3-YO daughter, and works full-time.  Yolanda Cooper's 3-YO daughter is often up at night, and so keeps Yolanda Cooper up a lot.  Consequently, Yolanda Cooper has come to rely on her sweet tea and/or soda to pick her up at times during the day.   Usual eating pattern includes 3 meals and 1-2 snacks per day. 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    Daily foods include coffee w/ 1 tsp sugar & milk, 0-12 oz soft drinks, 32-40 oz sweet tea (1/2 -2/3 c sugar to 8 c tea = >1 tbsp sugar per cup), inst flvrd oatmeal w/ almonds at bkfst, subway deli meat sub for lunch M-F, potato chips 3-5 X wk.  Avoided foods include hot dogs, raw onion, mayo, beets, most seafood.    24-hr recall: (Up at 7 AM) B (7:45 AM)-   Coffee w/ 1 tsp sugar & milk, 2 tangerines  Snk (8:45)-   Coffee w/ 1 tsp sugar & milk, 2 c Progresso chx orzo soup, 6 club house crkrs, 12 oz Coke L (11:15 PM)-  8" [place] sub, 1 bag chips, 12 oz sweet tea  Snk (4 PM)-  ~10 almonds,  D (7 PM)-  3 Bagel bites, HT 3" roast beef sub, 2 c clam chowder, oyster crackers, HonesTea (60 kcal) Snk (10 PM)-  1 tangerine  Progress Towards Goal(s):  In progress.   Nutritional Diagnosis:  NI-5.8.3 Inappropriate intake of types of carbohydrates (specify):   As related to beverages especially.  As evidenced by usual intake of sweet tea and soda daily.    Intervention:  Nutrition education.  Monitoring/Evaluation:  Dietary intake, exercise, and body weight in 8 week(s).

## 2013-04-08 NOTE — Patient Instructions (Addendum)
-   Adequate sleep will be important for you to manage your weight and your health.   - While you are on the antibiotic, be sure to eat some fermented food every day:  Yogurt, kefir, sauerkraut, kambucha.   - Google Asencion Noble, MD  - If you want good info from a local re. probiotics: Natural Alternatives Jesusita Oka). - A high-glycemic diet may contribute to fatty liver.  High-glycemic means foods that raiser blood sugar effectively.    - Most effective to promote fatty acid synthesis in the liver is FRUCTOSE.  (Table sugar, honey, maple syrup, high-fructose corn sweetener are all ~50% fructose.)   - Cereals/bread:  Choose the highest fiber product you can find.  Cereal: At least 5 g fiber per serving.  (You may like steel-cut oats; Google "overnight oats.") - Try using less sugar in your tea, and over time, decrease (slowly) the amount you use.  CONSISTENCY is key to success with this.  You need to measure accurately.   - Obtain at least 48 oz water daily.  Start your day with at least 16 oz of water.  Make this a routine.   - Obtain twice as many veg's as protein or carbohydrate foods for both lunch and dinner. - Protein with every meal will help with appetite management.    - Track your progress in some way.  Email [email or web address].    - Advance planning will be crucial to success.  Think about what foods you need to keep on hand all the time, i.e., eggs, beans, soy burgers, Amy's (or [employer]' 365) frozen burritos, frozen veg's.   - Follow-up appt Tues, Feb 3 at 1:30.    - AT FOLLOW-UP, WE WILL DISCUSS PHYSICAL ACTIVITY.

## 2013-04-28 ENCOUNTER — Encounter: Payer: Self-pay | Admitting: Family Medicine

## 2013-04-28 ENCOUNTER — Ambulatory Visit (INDEPENDENT_AMBULATORY_CARE_PROVIDER_SITE_OTHER): Payer: BC Managed Care – PPO | Admitting: Family Medicine

## 2013-04-28 VITALS — BP 113/81 | HR 73 | Resp 16 | Wt 181.0 lb

## 2013-04-28 DIAGNOSIS — M549 Dorsalgia, unspecified: Secondary | ICD-10-CM

## 2013-04-28 MED ORDER — TRAMADOL HCL 50 MG PO TABS: 50.0000 mg | ORAL_TABLET | Freq: Two times a day (BID) | ORAL | Status: DC | PRN

## 2013-04-28 MED ORDER — METHYLPREDNISOLONE SODIUM SUCC 125 MG IJ SOLR
125.0000 mg | Freq: Once | INTRAMUSCULAR | Status: AC
  Administered 2013-04-28: 125 mg via INTRAMUSCULAR

## 2013-04-28 MED ORDER — METAXALONE 800 MG PO TABS: 800.0000 mg | ORAL_TABLET | Freq: Three times a day (TID) | ORAL | Status: DC

## 2013-04-28 MED ORDER — KETOROLAC TROMETHAMINE 60 MG/2ML IM SOLN
60.0000 mg | Freq: Once | INTRAMUSCULAR | Status: AC
  Administered 2013-04-28: 60 mg via INTRAMUSCULAR

## 2013-04-28 NOTE — Patient Instructions (Signed)
1)  Back Pain - Try a combination of Ibuprofen 600 mg, Tylenol 1000 mg and Ultram up to twice a day for a week or so to give you pain relief; Chiropractic Adjustment; PT.  If after trying these things with no relief we can get an x-ray and eventually proceed to a MRI if needed.    Sciatica Sciatica is pain, weakness, numbness, or tingling along the path of the sciatic nerve. The nerve starts in the lower back and runs down the back of each leg. The nerve controls the muscles in the lower leg and in the back of the knee, while also providing sensation to the back of the thigh, lower leg, and the sole of your foot. Sciatica is a symptom of another medical condition. For instance, nerve damage or certain conditions, such as a herniated disk or bone spur on the spine, pinch or put pressure on the sciatic nerve. This causes the pain, weakness, or other sensations normally associated with sciatica. Generally, sciatica only affects one side of the body. CAUSES   Herniated or slipped disc.  Degenerative disk disease.  A pain disorder involving the narrow muscle in the buttocks (piriformis syndrome).  Pelvic injury or fracture.  Pregnancy.  Tumor (rare). SYMPTOMS  Symptoms can vary from mild to very severe. The symptoms usually travel from the low back to the buttocks and down the back of the leg. Symptoms can include:  Mild tingling or dull aches in the lower back, leg, or hip.  Numbness in the back of the calf or sole of the foot.  Burning sensations in the lower back, leg, or hip.  Sharp pains in the lower back, leg, or hip.  Leg weakness.  Severe back pain inhibiting movement. These symptoms may get worse with coughing, sneezing, laughing, or prolonged sitting or standing. Also, being overweight may worsen symptoms. DIAGNOSIS  Your caregiver will perform a physical exam to look for common symptoms of sciatica. He or she may ask you to do certain movements or activities that would trigger  sciatic nerve pain. Other tests may be performed to find the cause of the sciatica. These may include:  Blood tests.  X-rays.  Imaging tests, such as an MRI or CT scan. TREATMENT  Treatment is directed at the cause of the sciatic pain. Sometimes, treatment is not necessary and the pain and discomfort goes away on its own. If treatment is needed, your caregiver may suggest:  Over-the-counter medicines to relieve pain.  Prescription medicines, such as anti-inflammatory medicine, muscle relaxants, or narcotics.  Applying heat or ice to the painful area.  Steroid injections to lessen pain, irritation, and inflammation around the nerve.  Reducing activity during periods of pain.  Exercising and stretching to strengthen your abdomen and improve flexibility of your spine. Your caregiver may suggest losing weight if the extra weight makes the back pain worse.  Physical therapy.  Surgery to eliminate what is pressing or pinching the nerve, such as a bone spur or part of a herniated disk. HOME CARE INSTRUCTIONS   Only take over-the-counter or prescription medicines for pain or discomfort as directed by your caregiver.  Apply ice to the affected area for 20 minutes, 3 4 times a day for the first 48 72 hours. Then try heat in the same way.  Exercise, stretch, or perform your usual activities if these do not aggravate your pain.  Attend physical therapy sessions as directed by your caregiver.  Keep all follow-up appointments as directed by your  caregiver.  Do not wear high heels or shoes that do not provide proper support.  Check your mattress to see if it is too soft. A firm mattress may lessen your pain and discomfort. SEEK IMMEDIATE MEDICAL CARE IF:   You lose control of your bowel or bladder (incontinence).  You have increasing weakness in the lower back, pelvis, buttocks, or legs.  You have redness or swelling of your back.  You have a burning sensation when you  urinate.  You have pain that gets worse when you lie down or awakens you at night.  Your pain is worse than you have experienced in the past.  Your pain is lasting longer than 4 weeks.  You are suddenly losing weight without reason. MAKE SURE YOU:  Understand these instructions.  Will watch your condition.  Will get help right away if you are not doing well or get worse. Document Released: 04/02/2001 Document Revised: 10/08/2011 Document Reviewed: 08/18/2011 [facility] Patient Information 2014 [place], [place].

## 2013-04-28 NOTE — Progress Notes (Signed)
Subjective:    Patient ID: Yolanda Cooper, female    DOB: 03-13-1976, 38 y.o.   MRN: [id number]  Yolanda Cooper is here today complaining of intermittent back pain.  She has struggled with this problem off and on for almost a year.  She feels that this problem has progressively worsened since Thanksgiving.  She feels that carrying her 40 lb daughter while she was sick has increased her pain.    Back Pain This is a recurrent problem. The current episode started more than 1 year ago. The problem occurs intermittently. The problem has been waxing and waning since onset. The pain is present in the lumbar spine and sacro-iliac. The quality of the pain is described as cramping. The pain radiates to the right thigh, right knee and right foot. The pain is at a severity of 2/10. The pain is mild. The pain is worse during the night. The symptoms are aggravated by lying down and position. Pertinent negatives include no numbness or weakness. She has tried NSAIDs and ice for the symptoms. The treatment provided mild relief.    Review of Systems  Constitutional: Negative for activity change and unexpected weight change.  Musculoskeletal: Positive for back pain.  Neurological: Negative for weakness and numbness.    Past Medical History  Diagnosis Date  . Rosacea   . Anxiety   . Unspecified vitamin D deficiency   . Overweight   . Other chronic pain   . Nonspecific elevation of levels of transaminase and lactic acid dehydrogenase (LDH)   . Fatty liver      Past Surgical History  Procedure Laterality Date  . Cesarean section    . Cholecystectomy    . Wisdom tooth extraction       History   Social History Narrative   Marital Status:  Married Animator)    Children:  Son (Yolanda Cooper)  Daughter (Yolanda Cooper)   Pets:  None    Living Situation: Lives with spouse and children.    Occupation: Engineer, water Building surveyor)    Education:  [employer] BS    Tobacco Use/Exposure:  None    Alcohol Use:  Occasional   Drug Use:  None   Diet:  Regular   Exercise:  None   Hobbies:  Watching TV                  Family History  Problem Relation Age of Onset  . Diabetes Father   . Hyperlipidemia Father   . Hypertension Father   . Cancer Other     colon cancer   . Anuerysm Other   . Diabetes Paternal Grandmother      Current Outpatient Prescriptions on File Prior to Visit  Medication Sig Dispense Refill  . cycloSPORINE (RESTASIS) 0.05 % ophthalmic emulsion 1 drop 2 (two) times daily.      . Doxycycline Hyclate 20 MG CAPS Take 20 mg by mouth 2 (two) times daily.      Marland Kitchen ibuprofen (ADVIL,MOTRIN) 200 MG tablet Take 400 mg by mouth every 8 (eight) hours as needed for headache (She has not taken very much since her last office visit.). PRN      . PROBIOTIC PRODUCT PO Take by mouth.      . vitamin E 400 UNIT capsule Take 400 Units by mouth daily.       No current facility-administered medications on file prior to visit.     No Known Allergies   Immunization History  Administered Date(s) Administered  .  Influenza,inj,Quad PF,36+ Mos 01/26/2013  . MMR 03/07/2007  . Tdap 03/07/2007        Objective:   Physical Exam  Vitals reviewed. Constitutional: She appears well-nourished. No distress.  Neck: Normal range of motion. Neck supple.  Cardiovascular: Normal rate and regular rhythm.   Pulmonary/Chest: Effort normal and breath sounds normal.  Abdominal: She exhibits no distension.  Musculoskeletal: Normal range of motion. She exhibits tenderness. She exhibits no edema.  Neurological: Coordination normal.       Assessment & Plan:    Yolanda Cooper was seen today for back pain.  Diagnoses and associated orders for this visit:  Backache - traMADol (ULTRAM) 50 MG tablet; Take 1 tablet (50 mg total) by mouth every 12 (twelve) hours as needed. - metaxalone (SKELAXIN) 800 MG tablet; Take 1 tablet (800 mg total) by mouth 3 (three) times daily. - methylPREDNISolone  sodium succinate (SOLU-MEDROL) 125 mg/2 mL injection 125 mg; Inject 2 mLs (125 mg total) into the muscle once. - ketorolac (TORADOL) injection 60 mg; Inject 2 mLs (60 mg total) into the muscle once.

## 2013-05-25 ENCOUNTER — Encounter: Payer: Self-pay | Admitting: Family Medicine

## 2013-05-25 ENCOUNTER — Ambulatory Visit (INDEPENDENT_AMBULATORY_CARE_PROVIDER_SITE_OTHER): Payer: BC Managed Care – PPO | Admitting: Family Medicine

## 2013-05-25 VITALS — Ht 69.0 in | Wt 182.0 lb

## 2013-05-25 DIAGNOSIS — E663 Overweight: Secondary | ICD-10-CM

## 2013-05-25 DIAGNOSIS — K7689 Other specified diseases of liver: Secondary | ICD-10-CM

## 2013-05-25 DIAGNOSIS — K760 Fatty (change of) liver, not elsewhere classified: Secondary | ICD-10-CM

## 2013-05-25 NOTE — Patient Instructions (Addendum)
-   Read the handout on thinking patterns, and consider if any of those especially sound familiar.  Consider how you might address some of your negative thinking patterns.   - GOAL: Tea:  Decrease sugar by CONSISTENTLY using a smaller amount, i.e., 1/2 cup sugar (instead of 2/3 c) to 8 c of tea.  - In restaurants:  Order black tea, and sweeten yourself.  Use the SAME amount each time.   - GOAL: Obtain twice as many veg's as protein or carbohydrate foods for both lunch and dinner.  - Keep frozen veg's on hand.   - Try roasting veg's.   - Soups, salads.   - Raw veg's to go with lunch.   - GOAL: 16 oz water first thing in the morning.   - Protein at each:  For breakfast, try making your oatmeal with extra water; stir in 1/3 c powdered milk when serving.   - Aim for ~8 grams.  - Eat at least 3 meals and 1-2 snacks per day.  Aim for no more than 5 hours between eating.  - TRACK YOUR 3 MAIN GOALS ABOVE, using goals sheet provided today.   AT FOLLOW-UP, WE WILL DISCUSS PHYSICAL ACTIVITY.

## 2013-05-25 NOTE — Progress Notes (Signed)
Medical Nutrition Therapy:  Appt start time: 1330 end time:  1430.  Assessment:  Primary concerns today: Weight management and nonalcoholic fatty liver.  Yolanda Cooper has not been able to meet goals set in December.  She has had a very rough time recently, with numerous family issues related to health, including currently awaiting results of tests for her husband, concerning possibility of cancer.  She also talked of being unhappy at work, and her husband's very stressful job.  We reviewed the handout on thinking patterns, and some ideas for how to address negative thinking.  I believe Yolanda Cooper needs some more achievable goals right now; meeting her goals may help to instill the confidence she needs to keep up the effort.    Spent a significant amount of time discussing the impact of getting caught up in negative thinking on her ability to master new health behaviors.  Consequently, did not get to any talk of physical activity.  Yolanda Cooper said her sleep is not any better; 3-YO daughter still waking her in the night, sometimes multiple times.  This may well contribute to Takita's inability to lose weight so far.    24-hr recall:  (Up at 7 AM) B (8:40 AM)-  1/4 c dry steel-cut oats, 8 large blackberries, coffee w/ 3 tbsp 2% milk Snk (10 AM)-  Wt Watchers chips (90 kcal) L (1 PM)-  Subway ham sub w/ provolone & vegs & swt onion dressing, broc&chs soup, diet soda Snk ( PM)-  none D (6 PM)-  Chick[employer]fil[employer] sandwich, fresh fruit, 1/2 med ff, half-sweet, half-regular tea Snk ( PM)-  none  Progress Towards Goal(s):  In progress.   Nutritional Diagnosis:  No progress on NI-5.8.3 Inappropriate intake of types of carbohydrates (specify):   As related to beverages especially.  As evidenced by usual intake of sweet tea and soda daily.    Intervention:  Nutrition education.  Monitoring/Evaluation:  Dietary intake, exercise, and body weight in 5 week(s).

## 2013-05-28 ENCOUNTER — Other Ambulatory Visit: Payer: Self-pay | Admitting: *Deleted

## 2013-05-28 DIAGNOSIS — E559 Vitamin D deficiency, unspecified: Secondary | ICD-10-CM

## 2013-05-28 DIAGNOSIS — Z0000 Encounter for general adult medical examination without abnormal findings: Secondary | ICD-10-CM

## 2013-05-31 ENCOUNTER — Other Ambulatory Visit: Payer: BC Managed Care – PPO

## 2013-05-31 LAB — COMPLETE METABOLIC PANEL WITH GFR
ALT: 25 U/L (ref 0–35)
AST: 19 U/L (ref 0–37)
Albumin: 4.4 g/dL (ref 3.5–5.2)
Alkaline Phosphatase: 67 U/L (ref 39–117)
BUN: 10 mg/dL (ref 6–23)
CO2: 25 mEq/L (ref 19–32)
Calcium: 9.2 mg/dL (ref 8.4–10.5)
Chloride: 105 mEq/L (ref 96–112)
Creat: 0.61 mg/dL (ref 0.50–1.10)
GFR, Est African American: >89 mL/min
GFR, Est Non African American: >89 mL/min
Glucose, Bld: 91 mg/dL (ref 70–99)
Potassium: 4.1 mEq/L (ref 3.5–5.3)
Sodium: 139 mEq/L (ref 135–145)
Total Bilirubin: 0.4 mg/dL (ref 0.2–1.2)
Total Protein: 7 g/dL (ref 6.0–8.3)

## 2013-06-01 LAB — LIPID PANEL
Cholesterol: 124 mg/dL (ref 0–200)
HDL: 39 mg/dL — ABNORMAL LOW (ref >39)
LDL Cholesterol: 60 mg/dL (ref 0–99)
Total CHOL/HDL Ratio: 3.2 Ratio
Triglycerides: 123 mg/dL (ref <150)
VLDL: 25 mg/dL (ref 0–40)

## 2013-06-01 LAB — CBC WITH DIFFERENTIAL/PLATELET
Basophils Absolute: 0 10*3/uL (ref 0.0–0.1)
Basophils Relative: 0 % (ref 0–1)
Eosinophils Absolute: 0.1 10*3/uL (ref 0.0–0.7)
Eosinophils Relative: 1 % (ref 0–5)
HCT: 39.5 % (ref 36.0–46.0)
Hemoglobin: 13.4 g/dL (ref 12.0–15.0)
Lymphocytes Relative: 30 % (ref 12–46)
Lymphs Abs: 2.7 10*3/uL (ref 0.7–4.0)
MCH: 28.6 pg (ref 26.0–34.0)
MCHC: 33.9 g/dL (ref 30.0–36.0)
MCV: 84.4 fL (ref 78.0–100.0)
Monocytes Absolute: 0.8 10*3/uL (ref 0.1–1.0)
Monocytes Relative: 9 % (ref 3–12)
Neutro Abs: 5.5 10*3/uL (ref 1.7–7.7)
Neutrophils Relative %: 60 % (ref 43–77)
Platelets: 353 10*3/uL (ref 150–400)
RBC: 4.68 MIL/uL (ref 3.87–5.11)
RDW: 13.7 % (ref 11.5–15.5)
WBC: 9.1 10*3/uL (ref 4.0–10.5)

## 2013-06-01 LAB — VITAMIN D 25 HYDROXY (VIT D DEFICIENCY, FRACTURES): Vit D, 25-Hydroxy: 28 ng/mL — ABNORMAL LOW (ref 30–89)

## 2013-06-01 LAB — TSH: TSH: 3.31 u[IU]/mL (ref 0.350–4.500)

## 2013-06-03 ENCOUNTER — Ambulatory Visit (INDEPENDENT_AMBULATORY_CARE_PROVIDER_SITE_OTHER): Payer: BC Managed Care – PPO | Admitting: Family Medicine

## 2013-06-03 ENCOUNTER — Encounter: Payer: Self-pay | Admitting: Family Medicine

## 2013-06-03 VITALS — BP 101/69 | HR 80 | Resp 16 | Wt 180.0 lb

## 2013-06-03 DIAGNOSIS — R5383 Other fatigue: Secondary | ICD-10-CM

## 2013-06-03 DIAGNOSIS — M79609 Pain in unspecified limb: Secondary | ICD-10-CM

## 2013-06-03 DIAGNOSIS — R5381 Other malaise: Secondary | ICD-10-CM

## 2013-06-03 DIAGNOSIS — F3289 Other specified depressive episodes: Secondary | ICD-10-CM

## 2013-06-03 DIAGNOSIS — M549 Dorsalgia, unspecified: Secondary | ICD-10-CM

## 2013-06-03 DIAGNOSIS — F329 Major depressive disorder, single episode, unspecified: Secondary | ICD-10-CM

## 2013-06-03 MED ORDER — CITALOPRAM HYDROBROMIDE 20 MG PO TABS: 20.0000 mg | ORAL_TABLET | Freq: Every day | ORAL | Status: DC

## 2013-06-03 MED ORDER — METHYLPREDNISOLONE SODIUM SUCC 125 MG IJ SOLR
125.0000 mg | Freq: Once | INTRAMUSCULAR | Status: AC
  Administered 2013-06-03: 125 mg via INTRAMUSCULAR

## 2013-06-03 MED ORDER — BUPROPION HCL ER (XL) 300 MG PO TB24: 300.0000 mg | ORAL_TABLET | Freq: Every day | ORAL | Status: DC

## 2013-06-03 MED ORDER — BUPROPION HCL ER (XL) 150 MG PO TB24: 150.0000 mg | ORAL_TABLET | Freq: Every day | ORAL | Status: DC

## 2013-06-03 MED ORDER — DICLOFENAC SODIUM 1 % TD GEL: 4.0000 g | Freq: Four times a day (QID) | TRANSDERMAL | Status: DC

## 2013-06-03 NOTE — Patient Instructions (Signed)
1)  Leg Pain - Hot Bath with Epsom Salt, Voltaren Gel, Tiger Balm, Dr. Pearletha ForgeHudnall?

## 2013-06-03 NOTE — Progress Notes (Signed)
Subjective:    Patient ID: Yolanda Cooper, female    DOB: [birth year], 38 y.o.   MRN: [id number]  HPI  Yolanda Cooper is here today to go over her most recent lab results and to discuss the conditions listed below:  1)  Elevated Transaminases/Fatty Liver - She is seeing Alease Medina for nutrition counseling at the [facility].  She admits that she has not been watching her diet as carefully has she has been over the past several months.    2)  Mood/Stress - She has been very stressed recently.  Her husband has had some health issues that have worried her.   3)  Fatigue - She feels very fatigued.    4)  Right Hip Pain - She continues to have pain in her right hip.  She is hesitant to take medications for the pain since she tries to avoid medications due to her liver enzymes.  The only medicine she takes is Advil occasionally because it helps her to rest.       Review of Systems  Constitutional: Positive for fatigue.  Musculoskeletal: Positive for back pain.       Hip Pain (Right)   Psychiatric/Behavioral: Positive for sleep disturbance and dysphoric mood. The patient is nervous/anxious.        She is stressed.      Past Medical History  Diagnosis Date  . Rosacea   . Anxiety   . Unspecified vitamin D deficiency   . Overweight   . Other chronic pain   . Nonspecific elevation of levels of transaminase and lactic acid dehydrogenase (LDH)   . Fatty liver      Past Surgical History  Procedure Laterality Date  . Cesarean section    . Cholecystectomy    . Wisdom tooth extraction       History   Social History Narrative   Marital Status:  Married Animator)    Children:  Son (Will)  Daughter (Emmie)   Pets:  None    Living Situation: Lives with spouse and children.    Occupation: Engineer, water Building surveyor)    Education:  [employer] BS    Tobacco Use/Exposure:  None    Alcohol Use:  Occasional   Drug Use:  None   Diet:  Regular   Exercise:   None   Hobbies:  Watching TV                  Family History  Problem Relation Age of Onset  . Diabetes Father   . Hyperlipidemia Father   . Hypertension Father   . Cancer Other     colon cancer   . Anuerysm Other   . Diabetes Paternal Grandmother      Current Outpatient Prescriptions on File Prior to Visit  Medication Sig Dispense Refill  . cycloSPORINE (RESTASIS) 0.05 % ophthalmic emulsion 1 drop 2 (two) times daily.      . Doxycycline Hyclate 20 MG CAPS Take 20 mg by mouth 2 (two) times daily.      Marland Kitchen ibuprofen (ADVIL,MOTRIN) 200 MG tablet Take 400 mg by mouth every 8 (eight) hours as needed for headache (She has not taken very much since her last office visit.). PRN      . PROBIOTIC PRODUCT PO Take by mouth.      . vitamin E 400 UNIT capsule Take 400 Units by mouth daily.       No current facility-administered medications on file prior to visit.  No Known Allergies   Immunization History  Administered Date(s) Administered  . Influenza,inj,Quad PF,36+ Mos 01/26/2013  . MMR 03/07/2007  . Tdap 03/07/2007        Objective:   Physical Exam  Vitals reviewed. Constitutional: She is oriented to person, place, and time.  Eyes: Conjunctivae are normal. No scleral icterus.  Neck: Neck supple. No thyromegaly present.  Cardiovascular: Normal rate, regular rhythm and normal heart sounds.   Pulmonary/Chest: Effort normal and breath sounds normal.  Musculoskeletal: She exhibits tenderness (Right lower back/hip). She exhibits no edema.  Lymphadenopathy:    She has no cervical adenopathy.  Neurological: She is alert and oriented to person, place, and time.  Skin: Skin is warm and dry.  Psychiatric: Her behavior is normal. Judgment and thought content normal.  Stressed      Assessment & Plan:    Yolanda Cooper was seen today for lab results.  Diagnoses and associated orders for this visit:  Depressive disorder, not elsewhere classified Comments: She is to try a  combination of Celexa 20 mg at bedtime and Wellbutrin 150 mg for a month then 300 mg.  F/U in 6 weeks.   - buPROPion (WELLBUTRIN XL) 150 MG 24 hr tablet; Take 1 tablet (150 mg total) by mouth daily. - buPROPion (WELLBUTRIN XL) 300 MG 24 hr tablet; Take 1 tablet (300 mg total) by mouth daily. - citalopram (CELEXA) 20 MG tablet; Take 1 tablet (20 mg total) by mouth daily.  Other malaise and fatigue Comments: Hopefully, she will feel less tired with the above combination.   - buPROPion (WELLBUTRIN XL) 150 MG 24 hr tablet; Take 1 tablet (150 mg total) by mouth daily.  Pain in limb Comments: We discussed a variety of thing she can try for her leg pain including hot bath, Voltaren Gel, Tiger Balm and a F/U with Dr. Barbaraann Barthel.  She was also reminded about Integrative Therapies (PT/Acupuncture) that we discussed at her last visit.   - diclofenac sodium (VOLTAREN) 1 % GEL; Apply 4 g topically 4 (four) times daily. - methylPREDNISolone sodium succinate (SOLU-MEDROL) 125 mg/2 mL injection 125 mg; Inject 2 mLs (125 mg total) into the muscle once.  Backache Comments: Yolanda Cooper was seen a little over a month ago for hip/back pain.  I prescribed a couple of medications that she has not taken due to her fear about damaging her liver.  I also recommended a chiropractic adjustment vs PT and discussed getting an x-ray vs MRI if she did not improve and she has not followed up with any of these.  She is going to decide what she wants to do about her back.     TIME SPENT "FACE TO FACE" WITH PATIENT -  30 MINS

## 2013-06-07 ENCOUNTER — Encounter: Payer: Self-pay | Admitting: Family Medicine

## 2013-06-29 ENCOUNTER — Encounter: Payer: Self-pay | Admitting: Family Medicine

## 2013-06-29 ENCOUNTER — Ambulatory Visit (INDEPENDENT_AMBULATORY_CARE_PROVIDER_SITE_OTHER): Payer: BC Managed Care – PPO | Admitting: Family Medicine

## 2013-06-29 VITALS — Ht 69.0 in | Wt 182.3 lb

## 2013-06-29 DIAGNOSIS — K760 Fatty (change of) liver, not elsewhere classified: Secondary | ICD-10-CM

## 2013-06-29 DIAGNOSIS — K7689 Other specified diseases of liver: Secondary | ICD-10-CM

## 2013-06-29 DIAGNOSIS — E663 Overweight: Secondary | ICD-10-CM

## 2013-06-29 NOTE — Progress Notes (Signed)
Medical Nutrition Therapy:  Appt start time: 1000 end time:  1100.  Assessment:  Primary concerns today: Weight management and nonalcoholic fatty liver.  Yolanda Cooper not exercised in the past month, due, she feels to being "tired and lazy."  She is having pretty severe back pain from her computer work all day long.  She drank water first thing in the AM ~75% of the time.  She did not do well w/ limiting tea, nor with getting veg's.  She is usually 16 total oz of tea and Coke per day.   We had a long discussion about the stresses in her life right now, and how this impacts her ability to follow through with her goals.  I hope that Yolanda Cooper can see that she is being hard on herself, which is ultimately not constructive.      24-hr recall:  (Up at 7 AM) B (8:30 AM)-  1 c coffee w/ milk, Wt Wtchrs oatmeal, 15 almonds Snk (10:30)-  2 slc pizza, water L (2:30 PM)-  Subway 6" chx w/ veg's & chs, Doritos, 8 oz Coke Snk (5:30)-  1/2 bag Wt Wtcher BBQ chips, apple D (7 PM)-  3 c spagh w/ meat sauce, 1 slc garlic bread, 6 oz lemonade Snk ( PM)-  none Pretty typical day food-wise.    Progress Towards Goal(s):  In progress.   Nutritional Diagnosis:  No progress on NI-5.8.3 Inappropriate intake of types of carbohydrates (specify):   As related to beverages especially.  As evidenced by usual intake of sweet tea and soda daily.    Intervention:  Nutrition education.  Monitoring/Evaluation:  Dietary intake, exercise, and body weight in 4 weeks and in 6 week(s) for weight check.

## 2013-06-29 NOTE — Patient Instructions (Addendum)
-   Consider sports med appt for your back with Dr. Terrilee FilesZach Smith of [facility]([address], across from [facility]   - Remember:  WILLPOWER is a limited resource.  - Goals:  Better sleep, better nutrition, better appearance  - Better sleep:    - No caffeine after 12 noon  - Start night time routine by 7:20 PM.    - Give yourself a star on the calendar each night you accomplish this.    - Engage your husband and son in this effort.   - Nutrition:  - Limit sugary drinks to 12 oz per day.   - Obtain 16 oz water first thing in the day.    - Give yourself a star if you are successful in these goals.   - Exercise:    - Plan which days you will walk that week.  PUT THEM ON A CALENDAR OR IN YOUR PHONE.    - When you feel like you just don't want to do it, do not allow yourself to engage that thought.  Instead focus on specifically what your next step needs to be to make some activity happen.  Use the "5-minute rule."    - Record # of minutes walked on the laundry room calendar.    - Take the stairs up as often as possible.   - Email weekly on Fridays to let me know how you are doing with the above goals:  [email or web address].  - Wt checks on Apr 7 at 4 (or 4:30) and Apr 21 at 3:30.

## 2013-07-14 ENCOUNTER — Ambulatory Visit (INDEPENDENT_AMBULATORY_CARE_PROVIDER_SITE_OTHER): Payer: BC Managed Care – PPO | Admitting: Family Medicine

## 2013-07-14 ENCOUNTER — Encounter: Payer: Self-pay | Admitting: Family Medicine

## 2013-07-14 VITALS — BP 110/80 | HR 80 | Wt 185.0 lb

## 2013-07-14 DIAGNOSIS — M549 Dorsalgia, unspecified: Secondary | ICD-10-CM | POA: Insufficient documentation

## 2013-07-14 DIAGNOSIS — M9981 Other biomechanical lesions of cervical region: Secondary | ICD-10-CM

## 2013-07-14 DIAGNOSIS — M999 Biomechanical lesion, unspecified: Secondary | ICD-10-CM | POA: Insufficient documentation

## 2013-07-14 MED ORDER — MELOXICAM 15 MG PO TABS: 15.0000 mg | ORAL_TABLET | Freq: Every day | ORAL | Status: DC

## 2013-07-14 NOTE — Progress Notes (Signed)
Tawana ScaleZach Smith D.O. [facility] [address] [place], [place] [address] Phone: [phone] Subjective:     CC: back pain.   EXB:[id number]:Subjective Yolanda RoysChristine Cooper is a 38 y.o. female coming in with complaint of back pain that seems to radiate to her right hip. Patient states that this is not an every day occurrence. Patient does have to kids and she notices it more pain on the lower right side was ultimately. Patient is never any true injury. Patient has tried some over-the-counter medications such as ibuprofen with minimal benefit. Patient is also gone to a chiropractor with minimal benefit as well. Patient states that this shooting pain down her leg seems to be mostly on the lateral aspect is very intermittent clicking though to her foot. Patient states that she stands for long amount of time she can have some numbness in her foot but not the leg. Denies any significant weakness. Patient has been seen by her OB/GYN and workup has been benign. Patient describes the pain as more of a dull aching sensation that makes it difficult to get through the day. Patient rates the pain as 5/10 in severity.     Past medical history, social, surgical and family history all reviewed in electronic medical record.   Review of Systems: No headache, visual changes, nausea, vomiting, diarrhea, constipation, dizziness, abdominal pain, skin rash, fevers, chills, night sweats, weight loss, swollen lymph nodes, body aches, joint swelling, muscle aches, chest pain, shortness of breath, mood changes.   Objective Blood pressure 110/80, pulse 80, weight 185 lb (83.915 kg), last menstrual period 06/15/2013, SpO2 99.00%.  General: No apparent distress alert and oriented x3 mood and affect normal, dressed appropriately.  HEENT: Pupils equal, extraocular movements intact  Respiratory: Patient's speak in full sentences and does not appear short of breath  Cardiovascular: No lower extremity edema, non tender, no  erythema  Skin: Warm dry intact with no signs of infection or rash on extremities or on axial skeleton.  Abdomen: Soft nontender  Neuro: Cranial nerves II through XII are intact, neurovascularly intact in all extremities with 2+ DTRs and 2+ pulses.  Lymph: No lymphadenopathy of posterior or anterior cervical chain or axillae bilaterally.  Gait normal with good balance and coordination.  MSK:  Non tender with full range of motion and good stability and symmetric strength and tone of shoulders, elbows, wrist, hip, knee and ankles bilaterally. Poor course strength Back Exam:  Inspection: Unremarkable  Motion: Flexion 45 deg, Extension 45 deg, Side Bending to 45 deg bilaterally,  Rotation to 45 deg bilaterally  SLR laying: Negative  XSLR laying: Negative  Palpable tenderness: Over the right SI joint. FABER: Positive right. Sensory change: Gross sensation intact to all lumbar and sacral dermatomes.  Reflexes: 2+ at both patellar tendons, 2+ at achilles tendons, Babinski's downgoing.  Strength at foot  Plantar-flexion: 5/5 Dorsi-flexion: 5/5 Eversion: 5/5 Inversion: 5/5  Leg strength  Quad: 5/5 Hamstring: 5/5 Hip flexor: 5/5 Hip abductors: 5/5  Gait unremarkable.   OMT Physical Exam  Standing structural       Occiput left higher  Shoulder left higher   Standing flexion right on right  Seated Flexion right on right  Cervical  C4 flexed rotated and side bent right  Thoracic C5 extended rotated and side bent left  Lumbar L2 flexed rotated inside that right  Sacrum Left on left     Impression and Recommendations:     This case required medical decision making of moderate complexity.

## 2013-07-14 NOTE — Assessment & Plan Note (Signed)
Decision today to treat with OMT was based on Physical Exam  After verbal consent patient was treated with HVLA, ME techniques in cervical, thoracic lumbar and sacral areas  Patient tolerated the procedure well with improvement in symptoms  Patient given exercises, stretches and lifestyle modifications  See medications in patient instructions if given  Patient will follow up in 2weeks

## 2013-07-14 NOTE — Assessment & Plan Note (Signed)
Decision today to treat with OMT was based on Physical Exam  After verbal consent patient was treated with HVLA, ME techniques in cervical, thoracic lumbar and sacral areas  Patient tolerated the procedure well with improvement in symptoms  Patient given exercises, stretches and lifestyle modifications  See medications in patient instructions if given  Patient will follow up in 2weeks 

## 2013-07-14 NOTE — Patient Instructions (Signed)
Very nice to meet you Yolanda Cooper 20 minutes after activity can help Meloxicam daily 10 days then as needed Vitamin D 2000 IU daily. Posture exercise stand on wall with heels, butt, shoulders and head touching goal is 5 minutes daily.  Work chair take tennis ball and place between shoulder blades.  Sacroiliac Joint Mobilization and Rehab 1. Work on pretzel stretching, shoulder back and leg draped in front. 3-5 sets, 30 sec.. 2. hip abductor rotations. standing, hip flexion and rotation outward then inward. 3 sets, 15 reps. when can do comfortably, add ankle weights starting at 2 pounds.  3. cross over stretching - shoulder back to ground, same side leg crossover. 3-5 sets for 30 min..  4. rolling up and back knees to chest and rocking. 5. sacral tilt - 5 sets, hold for 5-10 seconds  Come back in 2 weeks.

## 2013-07-14 NOTE — Assessment & Plan Note (Signed)
Patient's back pain is secondary to muscle imbalances. Patient does not have any signs of a herniated disc or any other neurologic problem at this time. We discussed different treatment options and patient did decide on osteopathic manipulation. Patient did do very well with manipulative therapy today. Home exercise program was given per patient instructions. We'll do a very short dose of anti-inflammatories for any type of flare from the manipulation. We discussed icing protocol and changing were positioned as well as working on posture. Patient and will come back again in 2 weeks for further evaluation. At that time we should also look at her feet to see if these could be potentially contributing.

## 2013-07-15 ENCOUNTER — Encounter: Payer: Self-pay | Admitting: Family Medicine

## 2013-07-15 ENCOUNTER — Ambulatory Visit (INDEPENDENT_AMBULATORY_CARE_PROVIDER_SITE_OTHER): Payer: BC Managed Care – PPO | Admitting: Family Medicine

## 2013-07-15 VITALS — BP 110/77 | HR 79 | Resp 16 | Wt 181.0 lb

## 2013-07-15 DIAGNOSIS — M549 Dorsalgia, unspecified: Secondary | ICD-10-CM

## 2013-07-15 DIAGNOSIS — R5381 Other malaise: Secondary | ICD-10-CM | POA: Insufficient documentation

## 2013-07-15 DIAGNOSIS — R5383 Other fatigue: Secondary | ICD-10-CM

## 2013-07-15 DIAGNOSIS — F4323 Adjustment disorder with mixed anxiety and depressed mood: Secondary | ICD-10-CM

## 2013-07-15 MED ORDER — BUPROPION HCL ER (SR) 150 MG PO TB12: 150.0000 mg | ORAL_TABLET | Freq: Two times a day (BID) | ORAL | Status: DC

## 2013-07-15 NOTE — Progress Notes (Addendum)
Subjective:    Patient ID: Yolanda Cooper, female    DOB: 12/16/1975, 38 y.o.   MRN: [id number]  HPI  Yolanda Cooper is here today to discuss her current treatment for mood.  She has been taking Wellbutrin XL 150 mg and says that she has noted some improvement and "positive thoughts" since she has been on this medication.  She was a little worried about the side effects when she first started (nausea and sleep disturbances) but they seem to have resolved.  She is however a little hesitant to increase the Wellbutrin XL to 300 mg since she did have these side effects and wonders if she should just stay on the 150 mg.   Review of Systems  Constitutional: Positive for appetite change and fatigue. Negative for unexpected weight change.  HENT: Positive for sinus pressure.   Psychiatric/Behavioral: Positive for sleep disturbance. Negative for dysphoric mood, decreased concentration and agitation. The patient is nervous/anxious.      Past Medical History  Diagnosis Date  . Rosacea   . Anxiety   . Unspecified vitamin D deficiency   . Overweight   . Other chronic pain   . Nonspecific elevation of levels of transaminase and lactic acid dehydrogenase (LDH)   . Fatty liver      Past Surgical History  Procedure Laterality Date  . Cesarean section    . Cholecystectomy    . Wisdom tooth extraction       History   Social History Narrative   Marital Status:  Married Animator)    Children:  Son (Will)  Daughter (Emmie)   Pets:  None    Living Situation: Lives with spouse and children.    Occupation: Engineer, water Building surveyor)    Education:  [employer] BS    Tobacco Use/Exposure:  None    Alcohol Use:  Occasional   Drug Use:  None   Diet:  Regular   Exercise:  None   Hobbies:  Watching TV                  Family History  Problem Relation Age of Onset  . Diabetes Father   . Hyperlipidemia Father   . Hypertension Father   . Cancer Other     colon cancer    . Anuerysm Other   . Diabetes Paternal Grandmother      Current Outpatient Prescriptions on File Prior to Visit  Medication Sig Dispense Refill  . cycloSPORINE (RESTASIS) 0.05 % ophthalmic emulsion 1 drop 2 (two) times daily.      . Doxycycline Hyclate 20 MG CAPS Take 20 mg by mouth 2 (two) times daily.      . meloxicam (MOBIC) 15 MG tablet Take 1 tablet (15 mg total) by mouth daily.  30 tablet  0  . PROBIOTIC PRODUCT PO Take by mouth.      . vitamin E 400 UNIT capsule Take 400 Units by mouth daily.      Marland Kitchen ibuprofen (ADVIL,MOTRIN) 200 MG tablet Take 400 mg by mouth every 8 (eight) hours as needed for headache (She has not taken very much since her last office visit.). PRN       No current facility-administered medications on file prior to visit.     No Known Allergies   Immunization History  Administered Date(s) Administered  . Influenza,inj,Quad PF,36+ Mos 01/26/2013  . MMR 03/07/2007  . Tdap 03/07/2007      Objective:   Physical Exam  Constitutional: She is oriented  to person, place, and time. She appears well-nourished. No distress.  Eyes: Conjunctivae are normal. No scleral icterus.  Neck: Neck supple. No thyromegaly present.  Cardiovascular: Normal rate, regular rhythm and normal heart sounds.   Pulmonary/Chest: Effort normal and breath sounds normal. No respiratory distress.  Neurological: She is alert and oriented to person, place, and time.  Skin: Skin is warm and dry.  Psychiatric: She has a normal mood and affect. Her behavior is normal. Judgment and thought content normal.  She continues to be anxious.        Assessment & Plan:    Ashle was seen today for mood.  Diagnoses and associated orders for this visit:  Adjustment disorder with mixed anxiety and depressed mood Comments: It appears that the Wellbutrin has had some positive results with her mood.  We discussed the various options SR vs XL and decided that we'll have her try the SR to give her  some flexibility.  I gave her a prescription for citalopram at her last visit but she did not take it.  I still think that she would benefit from an SSRI but she is hesitant to add another medication.  She is to F/U in 3 months and we can discuss it further at that time.      Other malaise and fatigue Comments: Hopefully, she will feel more energetic when she increases the Wellbutrin to 300 mg.   - buPROPion (WELLBUTRIN SR) 150 MG 12 hr tablet; Take 1 tablet (150 mg total) by mouth 2 (two) times daily.  Backache Comments: She continued to have back/leg pain even after seeing her chiropractor (Dr. Tommy Rainwater) and was referred to Dr. Charlann Boxer who is a DO practicing Sports Medicine.  She received an adjustment by him yesterday and feels that she had a better night last night.

## 2013-07-29 ENCOUNTER — Encounter: Payer: Self-pay | Admitting: Family Medicine

## 2013-07-29 NOTE — Progress Notes (Signed)
Patient ID: Valerie RoysChristine Gearheart, female   DOB: 07-30-1975, 38 y.o.   MRN: [id number] Wynona CanesChristine has continued to have back pain.  She sees Dr. Katrinka BlazingSmith again tomorrow.    She has not done too well with her behavior goals, although she has done better the last couple days:  She has been sleeping better on days when stops caffeine by 1 PM.   She is starting the day with 20 oz of water and her coffee.   They have not managed to start the bedtime routine by 7:30 PM, especially now that baseball and soccer have started; usually a game or practice on weekdays.     Dietary Recommendations, 07-29-13    Talk with your husband about how you can possibly get the kids to bed earlier with consistency.  - If you get a plan together, start the stars on the calendar routine with both kids for meeting bedtimes.       Continue goals as before: - 20 oz of water in the morning.  - No caffeine after 1 PM.  - Include a vegetable for both lunch and dinner on at least 4 days a week.   - At least 5 minutes of exercise 5 days a week.  (The key to this working for you is planning ahead.) - BRING YOUR GOALS SHEET TO NEXT WT CHECK (4/21 at 3:30).

## 2013-07-30 ENCOUNTER — Ambulatory Visit (INDEPENDENT_AMBULATORY_CARE_PROVIDER_SITE_OTHER): Payer: BC Managed Care – PPO | Admitting: Family Medicine

## 2013-07-30 ENCOUNTER — Encounter: Payer: Self-pay | Admitting: Family Medicine

## 2013-07-30 ENCOUNTER — Ambulatory Visit (INDEPENDENT_AMBULATORY_CARE_PROVIDER_SITE_OTHER)
Admission: RE | Admit: 2013-07-30 | Discharge: 2013-07-30 | Disposition: A | Discharge status: Home / Self Care | Payer: BC Managed Care – PPO | Source: Ambulatory Visit | Attending: Family Medicine | Admitting: Family Medicine

## 2013-07-30 VITALS — BP 112/82 | HR 75

## 2013-07-30 DIAGNOSIS — IMO0001 Reserved for inherently not codable concepts without codable children: Secondary | ICD-10-CM

## 2013-07-30 DIAGNOSIS — M9981 Other biomechanical lesions of cervical region: Secondary | ICD-10-CM

## 2013-07-30 DIAGNOSIS — M533 Sacrococcygeal disorders, not elsewhere classified: Secondary | ICD-10-CM | POA: Insufficient documentation

## 2013-07-30 DIAGNOSIS — M7918 Myalgia, other site: Secondary | ICD-10-CM

## 2013-07-30 DIAGNOSIS — G8929 Other chronic pain: Secondary | ICD-10-CM

## 2013-07-30 DIAGNOSIS — M999 Biomechanical lesion, unspecified: Secondary | ICD-10-CM

## 2013-07-30 NOTE — Progress Notes (Signed)
  Tawana ScaleZach Jerryl Holzhauer D.O. [facility] [address] [place], [place] [address] Phone: [phone] Subjective:     CC: back pain.   [id number]:[id number]:Subjective Yolanda RoysChristine Sorbello is a 38 y.o. female coming in with complaint of back pain . 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 Patient has been doing home exercises intermittently. Patient states that she's only been doing to him to the other ones take too much time. She does not do this on a regular basis. Patient has not gotten the over-the-counter medications either. Patient states that she continues to have the same pain intermittently. Patient can have days when she has no pain and other days when she does not. States that she can still have some radiation down the posterior aspect of her leg from time to time. No weakness noted.     Past medical history, social, surgical and family history all reviewed in electronic medical record.   Review of Systems: No headache, visual changes, nausea, vomiting, diarrhea, constipation, dizziness, abdominal pain, skin rash, fevers, chills, night sweats, weight loss, swollen lymph nodes, body aches, joint swelling, muscle aches, chest pain, shortness of breath, mood changes.   Objective Blood pressure 112/82, pulse 75, last menstrual period 07/12/2013, SpO2 98.00%.  General: No apparent distress alert and oriented x3 mood and affect normal, dressed appropriately.  HEENT: Pupils equal, extraocular movements intact  Respiratory: Patient's speak in full sentences and does not appear short of breath  Cardiovascular: No lower extremity edema, non tender, no erythema  Skin: Warm dry intact with no signs of infection or rash on extremities or on axial skeleton.  Abdomen: Soft nontender  Neuro: Cranial nerves II through XII are intact, neurovascularly intact in all extremities with 2+ DTRs and 2+ pulses.  Lymph: No lymphadenopathy of posterior or anterior  cervical chain or axillae bilaterally.  Gait normal with good balance and coordination.  MSK:  Non tender with full range of motion and good stability and symmetric strength and tone of shoulders, elbows, wrist, hip, knee and ankles bilaterally. Poor course strength Back Exam:  Inspection: Unremarkable  Motion: Flexion 45 deg, Extension 45 deg, Side Bending to 45 deg bilaterally,  Rotation to 45 deg bilaterally  SLR laying: Negative  XSLR laying: Negative  Palpable tenderness: Over the right SI joint. FABER: Positive right. Sensory change: Gross sensation intact to all lumbar and sacral dermatomes.  Reflexes: 2+ at both patellar tendons, 2+ at achilles tendons, Babinski's downgoing.  Strength at foot  Plantar-flexion: 5/5 Dorsi-flexion: 5/5 Eversion: 5/5 Inversion: 5/5  Leg strength  Quad: 5/5 Hamstring: 5/5 Hip flexor: 5/5 Hip abductors: 4/5  Gait unremarkable.   OMT Physical Exam   Cervical  C4 flexed rotated and side bent right  Thoracic C5 extended rotated and side bent left  Lumbar L2 flexed rotated inside that right  Sacrum Left on left     Impression and Recommendations:     This case required medical decision making of moderate complexity.

## 2013-07-30 NOTE — Assessment & Plan Note (Signed)
Decision today to treat with OMT was based on Physical Exam  After verbal consent patient was treated with HVLA, ME techniques in cervical, thoracic lumbar and sacral areas  Patient tolerated the procedure well with improvement in symptoms  Patient given exercises, stretches and lifestyle modifications  See medications in patient instructions if given  Patient will follow up in 4 weeks      

## 2013-07-30 NOTE — Patient Instructions (Signed)
Good to see you Turmeric 500mg  twice daily.  Vitamin D 2000 IU daily Continue exercises at least 3 times a week and would do all of them.  Add in the piriformis exercisers.  Ice still when needed for only 20 minutes Physical therapy will be calling you.  Come back in 4 weeks.

## 2013-07-30 NOTE — Assessment & Plan Note (Signed)
Patient continues to have sacroiliac joint dysfunction. Patient does have significant anxiety think is also influencing her pain level from time to time. Patient will start formal physical therapy. Patient will continue with osteopathic manipulation as long as he continues to help. Patient is going to do home exercise program more regular basis and was given new exercises in a handout. Patient will come back again in 4 weeks for further evaluation.

## 2013-08-05 ENCOUNTER — Ambulatory Visit (INDEPENDENT_AMBULATORY_CARE_PROVIDER_SITE_OTHER): Payer: BC Managed Care – PPO

## 2013-08-05 DIAGNOSIS — G8929 Other chronic pain: Secondary | ICD-10-CM

## 2013-08-05 DIAGNOSIS — M6281 Muscle weakness (generalized): Secondary | ICD-10-CM

## 2013-08-05 DIAGNOSIS — M25559 Pain in unspecified hip: Secondary | ICD-10-CM

## 2013-08-05 DIAGNOSIS — M545 Low back pain, unspecified: Secondary | ICD-10-CM

## 2013-08-05 DIAGNOSIS — IMO0001 Reserved for inherently not codable concepts without codable children: Secondary | ICD-10-CM

## 2013-08-05 DIAGNOSIS — M533 Sacrococcygeal disorders, not elsewhere classified: Secondary | ICD-10-CM

## 2013-08-09 ENCOUNTER — Encounter (INDEPENDENT_AMBULATORY_CARE_PROVIDER_SITE_OTHER): Payer: BC Managed Care – PPO | Admitting: Physical Therapy

## 2013-08-09 DIAGNOSIS — M545 Low back pain, unspecified: Secondary | ICD-10-CM

## 2013-08-09 DIAGNOSIS — M25559 Pain in unspecified hip: Secondary | ICD-10-CM

## 2013-08-09 DIAGNOSIS — IMO0001 Reserved for inherently not codable concepts without codable children: Secondary | ICD-10-CM

## 2013-08-09 DIAGNOSIS — M533 Sacrococcygeal disorders, not elsewhere classified: Secondary | ICD-10-CM

## 2013-08-09 DIAGNOSIS — M6281 Muscle weakness (generalized): Secondary | ICD-10-CM

## 2013-08-09 DIAGNOSIS — G8929 Other chronic pain: Secondary | ICD-10-CM

## 2013-08-10 ENCOUNTER — Encounter: Payer: Self-pay | Admitting: Family Medicine

## 2013-08-10 NOTE — Progress Notes (Signed)
Patient ID: Yolanda RoysChristine Martello, female   DOB: [birth year], 38 y.o.   MRN: [id number]  Weight Check:  Essentially unchanged at 185.2 lb.   Changed goals today, as patient has not been able to achieve previously set goals (wiith exception of limiting caffeine to before 1 PM, which she feels has helped her to sleep a bit better):  1. Eliminate sodas. (One Rolan Lipa20-oz HonesTea is ok for each day.) 2. Include a vegetable at dinner on at least 4 days a week.   3. At least 10 minutes of exercise 2 days a week.  Wynona CanesChristine will track her progress on her goals sheet, which I asked her to bring to follow-up May 7.

## 2013-08-11 ENCOUNTER — Encounter (INDEPENDENT_AMBULATORY_CARE_PROVIDER_SITE_OTHER): Payer: BC Managed Care – PPO

## 2013-08-11 DIAGNOSIS — G8929 Other chronic pain: Secondary | ICD-10-CM

## 2013-08-11 DIAGNOSIS — M545 Low back pain, unspecified: Secondary | ICD-10-CM

## 2013-08-11 DIAGNOSIS — M25559 Pain in unspecified hip: Secondary | ICD-10-CM

## 2013-08-11 DIAGNOSIS — IMO0001 Reserved for inherently not codable concepts without codable children: Secondary | ICD-10-CM

## 2013-08-11 DIAGNOSIS — M6281 Muscle weakness (generalized): Secondary | ICD-10-CM

## 2013-08-11 DIAGNOSIS — M533 Sacrococcygeal disorders, not elsewhere classified: Secondary | ICD-10-CM

## 2013-08-16 ENCOUNTER — Encounter (INDEPENDENT_AMBULATORY_CARE_PROVIDER_SITE_OTHER): Payer: BC Managed Care – PPO | Admitting: Physical Therapy

## 2013-08-16 DIAGNOSIS — M6281 Muscle weakness (generalized): Secondary | ICD-10-CM

## 2013-08-16 DIAGNOSIS — M545 Low back pain, unspecified: Secondary | ICD-10-CM

## 2013-08-16 DIAGNOSIS — G8929 Other chronic pain: Secondary | ICD-10-CM

## 2013-08-16 DIAGNOSIS — IMO0001 Reserved for inherently not codable concepts without codable children: Secondary | ICD-10-CM

## 2013-08-16 DIAGNOSIS — M533 Sacrococcygeal disorders, not elsewhere classified: Secondary | ICD-10-CM

## 2013-08-16 DIAGNOSIS — M25559 Pain in unspecified hip: Secondary | ICD-10-CM

## 2013-08-18 ENCOUNTER — Encounter (INDEPENDENT_AMBULATORY_CARE_PROVIDER_SITE_OTHER): Payer: BC Managed Care – PPO

## 2013-08-18 DIAGNOSIS — M25559 Pain in unspecified hip: Secondary | ICD-10-CM

## 2013-08-18 DIAGNOSIS — M545 Low back pain, unspecified: Secondary | ICD-10-CM

## 2013-08-18 DIAGNOSIS — M6281 Muscle weakness (generalized): Secondary | ICD-10-CM

## 2013-08-18 DIAGNOSIS — IMO0001 Reserved for inherently not codable concepts without codable children: Secondary | ICD-10-CM

## 2013-08-18 DIAGNOSIS — M533 Sacrococcygeal disorders, not elsewhere classified: Secondary | ICD-10-CM

## 2013-08-18 DIAGNOSIS — G8929 Other chronic pain: Secondary | ICD-10-CM

## 2013-08-23 ENCOUNTER — Encounter (INDEPENDENT_AMBULATORY_CARE_PROVIDER_SITE_OTHER): Payer: BC Managed Care – PPO | Admitting: Physical Therapy

## 2013-08-23 DIAGNOSIS — M545 Low back pain, unspecified: Secondary | ICD-10-CM

## 2013-08-23 DIAGNOSIS — M25559 Pain in unspecified hip: Secondary | ICD-10-CM

## 2013-08-23 DIAGNOSIS — M533 Sacrococcygeal disorders, not elsewhere classified: Secondary | ICD-10-CM

## 2013-08-23 DIAGNOSIS — IMO0001 Reserved for inherently not codable concepts without codable children: Secondary | ICD-10-CM

## 2013-08-23 DIAGNOSIS — G8929 Other chronic pain: Secondary | ICD-10-CM

## 2013-08-23 DIAGNOSIS — M6281 Muscle weakness (generalized): Secondary | ICD-10-CM

## 2013-08-26 ENCOUNTER — Ambulatory Visit (INDEPENDENT_AMBULATORY_CARE_PROVIDER_SITE_OTHER): Payer: BC Managed Care – PPO | Admitting: Emergency Medicine

## 2013-08-26 ENCOUNTER — Ambulatory Visit: Payer: BC Managed Care – PPO | Admitting: Family Medicine

## 2013-08-26 VITALS — Ht 69.0 in | Wt 186.4 lb

## 2013-08-26 DIAGNOSIS — K7689 Other specified diseases of liver: Secondary | ICD-10-CM

## 2013-08-26 DIAGNOSIS — E663 Overweight: Secondary | ICD-10-CM

## 2013-08-26 DIAGNOSIS — K760 Fatty (change of) liver, not elsewhere classified: Secondary | ICD-10-CM

## 2013-08-26 NOTE — Progress Notes (Signed)
Patient ID: Yolanda RoysChristine Doggett, female   DOB: 1975-11-02, 38 y.o.   MRN: [id number] [facility] Nutrition Therapy:   Assessment:  Primary concerns today: sleep and feeling out of control around food.  Previous Goal:  1. No caffeine after 1pm 2. Veggies with lunch and dinner 3. Exercise 5 minutes 5 days a wekk  Progress Towards Goal(s):  No progress. 1. She states she initially did well with no caffeine after 1pm, but then "fell off the wagon."  She uses this as a crutch when she is tired. 2. Has not really made any progress with this goal. 3. Has not been able to get any exercise.  When addressing barriers, she identifies fatigue as a large contributor.  She continues to have difficulty with sleep.  She has made progress with her daughter staying in her own bed at night.  However, her back pain will often wake her up in the middle of the night.  She has not been taking ibuprofen, meloxicam or Tumeric regularly.  She states the physical therapy really helps for a few days, then she starts to backslide.  Also reports that the Wellbutrin she is on has been messing with her sleep.  Assessment: Lack of good sleep is likely playing a large role in her stress and difficulty with food control and weight loss.  Plan: Simplified goals to 2: 5 minutes of exercise 5 days a week and no caffeine after 1pm. Emphasized importance of good sleep.  Recommended taking tumeric and ibuprofen regularly to help with her back pain.  Also suggest a hot bath and a muscle rub before bed. Discussed journaling and using the questions - how do I feel, how to I want to feel, and what to I need - to address lack of control with food. Recommended playing music in the car and at home as she loves music.    Follow Up:  Dietary intake, exercise, and body weight on 6/4 at 9:30am.  I spent 30 minutes with the patient, > 50% spent in counseling the patient.

## 2013-08-26 NOTE — Patient Instructions (Signed)
Take ibuprofen 2 tablets about 30 minutes before bed. Take the Tumeric twice a day, even if you feel good. Take the vitamin D every day.    Try a hot bath followed by muscle rub to help the muscles stay relaxed.  Natural Alternatives to get supplements.  Talk to Dr. Alberteen SamZanard about the Wellbutrin and your sleep.   If you are struggling with a food decision... 1. How do I feel right now? (tired, controlled, irritated) 2. How do I want to feel? (balanced, calm, appreciated) 3. What do I truly need right now? (choice, freedom, spontaneity) Keep track of your answers in a journal.   Play music in the car and in your home.  Schedule an appointment for Nutrition Clinic on June 4th at 9:30am (60 minutes).

## 2013-08-27 ENCOUNTER — Ambulatory Visit (INDEPENDENT_AMBULATORY_CARE_PROVIDER_SITE_OTHER): Payer: BC Managed Care – PPO | Admitting: Family Medicine

## 2013-08-27 ENCOUNTER — Encounter: Payer: Self-pay | Admitting: Family Medicine

## 2013-08-27 VITALS — BP 110/80 | HR 76

## 2013-08-27 DIAGNOSIS — M999 Biomechanical lesion, unspecified: Secondary | ICD-10-CM

## 2013-08-27 DIAGNOSIS — M533 Sacrococcygeal disorders, not elsewhere classified: Secondary | ICD-10-CM

## 2013-08-27 DIAGNOSIS — F4323 Adjustment disorder with mixed anxiety and depressed mood: Secondary | ICD-10-CM

## 2013-08-27 DIAGNOSIS — M9981 Other biomechanical lesions of cervical region: Secondary | ICD-10-CM

## 2013-08-27 MED ORDER — HYDROXYZINE HCL 25 MG PO TABS: 25.0000 mg | ORAL_TABLET | Freq: Three times a day (TID) | ORAL | Status: DC | PRN

## 2013-08-27 NOTE — Assessment & Plan Note (Signed)
Decision today to treat with OMT was based on Physical Exam  After verbal consent patient was treated with HVLA, ME techniques in cervical, thoracic lumbar and sacral areas  Patient tolerated the procedure well with improvement in symptoms  Patient given exercises, stretches and lifestyle modifications  See medications in patient instructions if given  Patient will follow up in 4-6 weeks      

## 2013-08-27 NOTE — Patient Instructions (Addendum)
Good to see you Continue what you are doing You are doing great! Tennis ball to back right pocket.  Hydroxyzine. Can take up to 3 times a day.  Come back in 4-6 weeks.

## 2013-08-27 NOTE — Assessment & Plan Note (Signed)
Patient is no longer taking Wellbutrin because of the side effects she states. Patient is going to discuss with primary care provider but can tell the patient is anxious today. Patient was given some hydroxyzine for short-term measured. The patient likely she can continue it but probably a daily medication would be a better option.

## 2013-08-27 NOTE — Assessment & Plan Note (Signed)
Patient is doing better. Overall patient will continue the exercises. We discussed other exercises in sitting position that could be beneficial. We also discussed massage that could be helpful for patient. Patient will continue physical therapy and come back again in 4 weeks for further evaluation.

## 2013-08-27 NOTE — Progress Notes (Signed)
  Tawana ScaleZach Landen Knoedler D.O. [facility] [address] [place], [place] [address] Phone: [phone] Subjective:     CC: back pain follow up   BJY:[id number]:Subjective Valerie RoysChristine Groninger is a 38 y.o. female coming in with complaint of back pain. Patient was diagnosed with piriformis syndrome as well as sacroiliac joint dysfunction. Patient hasn't started formal physical therapy and states that she is approximately 75% better. Patient continues the over-the-counter medications as well as the exercise program. Patient denies any new symptoms. Still states that her leg can feel somewhat weak at the end of a long day. Patient though states that overall she's been very good may be some increasing anxiety. Patient is not taking her Wellbutrin and is on discuss with her primary care provider.     Past medical history, social, surgical and family history all reviewed in electronic medical record.   Review of Systems: No headache, visual changes, nausea, vomiting, diarrhea, constipation, dizziness, abdominal pain, skin rash, fevers, chills, night sweats, weight loss, swollen lymph nodes, body aches, joint swelling, muscle aches, chest pain, shortness of breath, mood changes.   Objective Blood pressure 110/80, pulse 76, last menstrual period 07/12/2013, SpO2 97.00%.  General: No apparent distress alert and oriented x3 mood and affect normal, dressed appropriately.  HEENT: Pupils equal, extraocular movements intact  Respiratory: Patient's speak in full sentences and does not appear short of breath  Cardiovascular: No lower extremity edema, non tender, no erythema  Skin: Warm dry intact with no signs of infection or rash on extremities or on axial skeleton.  Abdomen: Soft nontender  Neuro: Cranial nerves II through XII are intact, neurovascularly intact in all extremities with 2+ DTRs and 2+ pulses.  Lymph: No lymphadenopathy of posterior or anterior cervical chain or axillae bilaterally.  Gait normal with  good balance and coordination.  MSK:  Non tender with full range of motion and good stability and symmetric strength and tone of shoulders, elbows, wrist, hip, knee and ankles bilaterally. Poor course strength Back Exam:  Inspection: Unremarkable  Motion: Flexion 45 deg, Extension 45 deg, Side Bending to 45 deg bilaterally,  Rotation to 45 deg bilaterally  SLR laying: Negative  XSLR laying: Negative  Palpable tenderness: Over the right SI joint. FABER: Positive right. Better then previous exam.  Sensory change: Gross sensation intact to all lumbar and sacral dermatomes.  Reflexes: 2+ at both patellar tendons, 2+ at achilles tendons, Babinski's downgoing.  Strength at foot  Plantar-flexion: 5/5 Dorsi-flexion: 5/5 Eversion: 5/5 Inversion: 5/5  Leg strength  Quad: 5/5 Hamstring: 5/5 Hip flexor: 5/5 Hip abductors: 4/5  Gait unremarkable.   OMT Physical Exam  Cervical  C4 flexed rotated and side bent right, some more tightness in musculature.  Thoracic T5 extended rotated and side bent left  Lumbar L2 flexed rotated inside that right  Sacrum Left on left     Impression and Recommendations:     This case required medical decision making of moderate complexity.

## 2013-08-30 ENCOUNTER — Encounter (INDEPENDENT_AMBULATORY_CARE_PROVIDER_SITE_OTHER): Payer: BC Managed Care – PPO | Admitting: Physical Therapy

## 2013-08-30 DIAGNOSIS — M545 Low back pain, unspecified: Secondary | ICD-10-CM

## 2013-08-30 DIAGNOSIS — M533 Sacrococcygeal disorders, not elsewhere classified: Secondary | ICD-10-CM

## 2013-08-30 DIAGNOSIS — G8929 Other chronic pain: Secondary | ICD-10-CM

## 2013-08-30 DIAGNOSIS — M25559 Pain in unspecified hip: Secondary | ICD-10-CM

## 2013-08-30 DIAGNOSIS — IMO0001 Reserved for inherently not codable concepts without codable children: Secondary | ICD-10-CM

## 2013-08-30 DIAGNOSIS — M6281 Muscle weakness (generalized): Secondary | ICD-10-CM

## 2013-09-01 ENCOUNTER — Encounter (INDEPENDENT_AMBULATORY_CARE_PROVIDER_SITE_OTHER): Payer: BC Managed Care – PPO | Admitting: Physical Therapy

## 2013-09-01 DIAGNOSIS — G8929 Other chronic pain: Secondary | ICD-10-CM

## 2013-09-01 DIAGNOSIS — M545 Low back pain, unspecified: Secondary | ICD-10-CM

## 2013-09-01 DIAGNOSIS — M6281 Muscle weakness (generalized): Secondary | ICD-10-CM

## 2013-09-01 DIAGNOSIS — M533 Sacrococcygeal disorders, not elsewhere classified: Secondary | ICD-10-CM

## 2013-09-01 DIAGNOSIS — M25559 Pain in unspecified hip: Secondary | ICD-10-CM

## 2013-09-01 DIAGNOSIS — IMO0001 Reserved for inherently not codable concepts without codable children: Secondary | ICD-10-CM

## 2013-09-06 ENCOUNTER — Encounter (INDEPENDENT_AMBULATORY_CARE_PROVIDER_SITE_OTHER): Payer: BC Managed Care – PPO | Admitting: Physical Therapy

## 2013-09-06 DIAGNOSIS — M6281 Muscle weakness (generalized): Secondary | ICD-10-CM

## 2013-09-06 DIAGNOSIS — M545 Low back pain, unspecified: Secondary | ICD-10-CM

## 2013-09-06 DIAGNOSIS — IMO0001 Reserved for inherently not codable concepts without codable children: Secondary | ICD-10-CM

## 2013-09-06 DIAGNOSIS — M25559 Pain in unspecified hip: Secondary | ICD-10-CM

## 2013-09-06 DIAGNOSIS — G8929 Other chronic pain: Secondary | ICD-10-CM

## 2013-09-06 DIAGNOSIS — M533 Sacrococcygeal disorders, not elsewhere classified: Secondary | ICD-10-CM

## 2013-09-08 ENCOUNTER — Encounter (INDEPENDENT_AMBULATORY_CARE_PROVIDER_SITE_OTHER): Payer: BC Managed Care – PPO

## 2013-09-08 DIAGNOSIS — M6281 Muscle weakness (generalized): Secondary | ICD-10-CM

## 2013-09-08 DIAGNOSIS — IMO0001 Reserved for inherently not codable concepts without codable children: Secondary | ICD-10-CM

## 2013-09-08 DIAGNOSIS — G8929 Other chronic pain: Secondary | ICD-10-CM

## 2013-09-08 DIAGNOSIS — M545 Low back pain, unspecified: Secondary | ICD-10-CM

## 2013-09-08 DIAGNOSIS — M533 Sacrococcygeal disorders, not elsewhere classified: Secondary | ICD-10-CM

## 2013-09-08 DIAGNOSIS — M25559 Pain in unspecified hip: Secondary | ICD-10-CM

## 2013-09-15 ENCOUNTER — Encounter (INDEPENDENT_AMBULATORY_CARE_PROVIDER_SITE_OTHER): Payer: BC Managed Care – PPO | Admitting: Physical Therapy

## 2013-09-15 DIAGNOSIS — IMO0001 Reserved for inherently not codable concepts without codable children: Secondary | ICD-10-CM

## 2013-09-15 DIAGNOSIS — M25559 Pain in unspecified hip: Secondary | ICD-10-CM

## 2013-09-15 DIAGNOSIS — M545 Low back pain, unspecified: Secondary | ICD-10-CM

## 2013-09-15 DIAGNOSIS — M533 Sacrococcygeal disorders, not elsewhere classified: Secondary | ICD-10-CM

## 2013-09-15 DIAGNOSIS — G8929 Other chronic pain: Secondary | ICD-10-CM

## 2013-09-15 DIAGNOSIS — M6281 Muscle weakness (generalized): Secondary | ICD-10-CM

## 2013-09-20 ENCOUNTER — Encounter (INDEPENDENT_AMBULATORY_CARE_PROVIDER_SITE_OTHER): Payer: BC Managed Care – PPO | Admitting: Physical Therapy

## 2013-09-20 DIAGNOSIS — M545 Low back pain, unspecified: Secondary | ICD-10-CM

## 2013-09-20 DIAGNOSIS — M25559 Pain in unspecified hip: Secondary | ICD-10-CM

## 2013-09-20 DIAGNOSIS — M533 Sacrococcygeal disorders, not elsewhere classified: Secondary | ICD-10-CM

## 2013-09-20 DIAGNOSIS — M6281 Muscle weakness (generalized): Secondary | ICD-10-CM

## 2013-09-20 DIAGNOSIS — G8929 Other chronic pain: Secondary | ICD-10-CM

## 2013-09-20 DIAGNOSIS — IMO0001 Reserved for inherently not codable concepts without codable children: Secondary | ICD-10-CM

## 2013-09-22 ENCOUNTER — Encounter (INDEPENDENT_AMBULATORY_CARE_PROVIDER_SITE_OTHER): Payer: BC Managed Care – PPO | Admitting: Physical Therapy

## 2013-09-22 DIAGNOSIS — M533 Sacrococcygeal disorders, not elsewhere classified: Secondary | ICD-10-CM

## 2013-09-22 DIAGNOSIS — M545 Low back pain, unspecified: Secondary | ICD-10-CM

## 2013-09-22 DIAGNOSIS — M25559 Pain in unspecified hip: Secondary | ICD-10-CM

## 2013-09-22 DIAGNOSIS — G8929 Other chronic pain: Secondary | ICD-10-CM

## 2013-09-22 DIAGNOSIS — IMO0001 Reserved for inherently not codable concepts without codable children: Secondary | ICD-10-CM

## 2013-09-22 DIAGNOSIS — M6281 Muscle weakness (generalized): Secondary | ICD-10-CM

## 2013-09-23 ENCOUNTER — Ambulatory Visit: Payer: BC Managed Care – PPO | Admitting: Family Medicine

## 2013-09-29 ENCOUNTER — Encounter (INDEPENDENT_AMBULATORY_CARE_PROVIDER_SITE_OTHER): Payer: BC Managed Care – PPO | Admitting: Physical Therapy

## 2013-09-29 DIAGNOSIS — M545 Low back pain, unspecified: Secondary | ICD-10-CM

## 2013-09-29 DIAGNOSIS — M533 Sacrococcygeal disorders, not elsewhere classified: Secondary | ICD-10-CM

## 2013-09-29 DIAGNOSIS — IMO0001 Reserved for inherently not codable concepts without codable children: Secondary | ICD-10-CM

## 2013-09-29 DIAGNOSIS — M25559 Pain in unspecified hip: Secondary | ICD-10-CM

## 2013-09-29 DIAGNOSIS — M6281 Muscle weakness (generalized): Secondary | ICD-10-CM

## 2013-09-29 DIAGNOSIS — G8929 Other chronic pain: Secondary | ICD-10-CM

## 2013-10-06 ENCOUNTER — Encounter (INDEPENDENT_AMBULATORY_CARE_PROVIDER_SITE_OTHER): Payer: BC Managed Care – PPO | Admitting: Physical Therapy

## 2013-10-06 DIAGNOSIS — M545 Low back pain, unspecified: Secondary | ICD-10-CM

## 2013-10-06 DIAGNOSIS — G8929 Other chronic pain: Secondary | ICD-10-CM

## 2013-10-06 DIAGNOSIS — IMO0001 Reserved for inherently not codable concepts without codable children: Secondary | ICD-10-CM

## 2013-10-06 DIAGNOSIS — M25559 Pain in unspecified hip: Secondary | ICD-10-CM

## 2013-10-06 DIAGNOSIS — M533 Sacrococcygeal disorders, not elsewhere classified: Secondary | ICD-10-CM

## 2013-10-06 DIAGNOSIS — M6281 Muscle weakness (generalized): Secondary | ICD-10-CM

## 2013-10-13 ENCOUNTER — Encounter (INDEPENDENT_AMBULATORY_CARE_PROVIDER_SITE_OTHER): Payer: BC Managed Care – PPO | Admitting: Physical Therapy

## 2013-10-13 DIAGNOSIS — M6281 Muscle weakness (generalized): Secondary | ICD-10-CM

## 2013-10-13 DIAGNOSIS — IMO0001 Reserved for inherently not codable concepts without codable children: Secondary | ICD-10-CM

## 2013-10-13 DIAGNOSIS — G8929 Other chronic pain: Secondary | ICD-10-CM

## 2013-10-13 DIAGNOSIS — M545 Low back pain, unspecified: Secondary | ICD-10-CM

## 2013-10-13 DIAGNOSIS — M533 Sacrococcygeal disorders, not elsewhere classified: Secondary | ICD-10-CM

## 2013-10-13 DIAGNOSIS — M25559 Pain in unspecified hip: Secondary | ICD-10-CM

## 2013-10-15 ENCOUNTER — Ambulatory Visit: Payer: BC Managed Care – PPO | Admitting: Family Medicine

## 2013-10-19 ENCOUNTER — Ambulatory Visit (INDEPENDENT_AMBULATORY_CARE_PROVIDER_SITE_OTHER): Payer: BC Managed Care – PPO | Admitting: Family Medicine

## 2013-10-19 ENCOUNTER — Encounter: Payer: Self-pay | Admitting: Family Medicine

## 2013-10-19 VITALS — BP 118/78 | HR 77 | Ht 69.0 in | Wt 192.0 lb

## 2013-10-19 DIAGNOSIS — M533 Sacrococcygeal disorders, not elsewhere classified: Secondary | ICD-10-CM

## 2013-10-19 DIAGNOSIS — R5381 Other malaise: Secondary | ICD-10-CM

## 2013-10-19 DIAGNOSIS — M999 Biomechanical lesion, unspecified: Secondary | ICD-10-CM

## 2013-10-19 DIAGNOSIS — R5383 Other fatigue: Secondary | ICD-10-CM

## 2013-10-19 DIAGNOSIS — M9981 Other biomechanical lesions of cervical region: Secondary | ICD-10-CM

## 2013-10-19 NOTE — Assessment & Plan Note (Signed)
Patient continues to have some fatigue. Because of this I would wonder patient should be treated for a subacute thyroid with a TSH of 3.3. We'll discuss at followup. Discussed that she should probably progress up more with her primary care Yolanda Cooper.

## 2013-10-19 NOTE — Progress Notes (Signed)
  Tawana ScaleZach Cooper D.O. [facility] [address] [place], [place] [address] Phone: [phone] Subjective:     CC: back pain follow up   OZH:YQMVHQIONGHPI:Subjective Yolanda RoysChristine Cooper is a 38 y.o. female coming in with complaint of back pain. Patient was diagnosed with piriformis syndrome as well as sacroiliac joint dysfunction. Patient has completed formal physical therapy, been doing home exercises, as well as over-the-counter medications. Patient states overall she has been doing fairly well. Patient is complaining more about some fatigue and some back discomfort that occurs at the end of the day. When patient does her exercises she does feel better. Denies any significant radiation down her legs or any numbness. Denies any new symptoms. Patient has not been doing exercises on a regular basis. Patient was given some hydroxyzine for anxiety which she has not been taking regularly. Patient though overall has been sleeping comfortably.     Past medical history, social, surgical and family history all reviewed in electronic medical record.   Review of Systems: No headache, visual changes, nausea, vomiting, diarrhea, constipation, dizziness, abdominal pain, skin rash, fevers, chills, night sweats, weight loss, swollen lymph nodes, body aches, joint swelling, muscle aches, chest pain, shortness of breath, mood changes.   Objective Blood pressure 118/78, pulse 77, height 5\' 9"  (1.753 m), weight 192 lb (87.091 kg), SpO2 99.00%.  General: No apparent distress alert and oriented x3 mood and affect normal, dressed appropriately.  HEENT: Pupils equal, extraocular movements intact  Respiratory: Patient's speak in full sentences and does not appear short of breath  Cardiovascular: No lower extremity edema, non tender, no erythema  Skin: Warm dry intact with no signs of infection or rash on extremities or on axial skeleton.  Abdomen: Soft nontender  Neuro: Cranial nerves II through XII are intact,  neurovascularly intact in all extremities with 2+ DTRs and 2+ pulses.  Lymph: No lymphadenopathy of posterior or anterior cervical chain or axillae bilaterally.  Gait normal with good balance and coordination.  MSK:  Non tender with full range of motion and good stability and symmetric strength and tone of shoulders, elbows, wrist, hip, knee and ankles bilaterally. Poor course strength Back Exam:  Inspection: Unremarkable  Motion: Flexion 45 deg, Extension 45 deg, Side Bending to 45 deg bilaterally,  Rotation to 45 deg bilaterally  SLR laying: Negative  XSLR laying: Negative  Palpable tenderness: Continued tenderness over the right SI joint FABER: Positive right. Better then previous exam.  Sensory change: Gross sensation intact to all lumbar and sacral dermatomes.  Reflexes: 2+ at both patellar tendons, 2+ at achilles tendons, Babinski's downgoing.  Strength at foot  Plantar-flexion: 5/5 Dorsi-flexion: 5/5 Eversion: 5/5 Inversion: 5/5  Leg strength  Quad: 5/5 Hamstring: 5/5 Hip flexor: 5/5 Hip abductors: 4/5 no improvement Gait unremarkable.   OMT Physical Exam  Cervical  C4 flexed rotated and side bent right   Thoracic T5 extended rotated and side bent left T3 extended rotated and side bent right  Lumbar L2 flexed rotated inside that right  Sacrum Left on left    Impression and Recommendations:     This case required medical decision making of moderate complexity.

## 2013-10-19 NOTE — Assessment & Plan Note (Signed)
The patient continues to have some sacroiliac joint dysfunction. We discussed about icing as well as the proper sitting position at work that could be beneficial. Patient may be getting an exercise ball and we've discussed how this may take 2-4 weeks to become comfortable with the sitting position. We discussed continuing the over-the-counter medications the patient was given a topical anti-inflammatory to try. Patient will try this and come back again in 4 weeks for further evaluation and treatment.  Spent greater than 25 minutes with patient face-to-face and had greater than 50% of counseling including as described above in assessment and plan.

## 2013-10-19 NOTE — Patient Instructions (Signed)
Great to see you Continue the exercises 3 times a week.  Y-T-A 2 seconds in each position 10 reps daily Turmeric is great Try pennsaid 2 times daily when you need it.  Consider [facility] [place] for PT if you want to check it out For sitting either get a foot stool or consider the exercise ball but remember it will take 2-4 weeks to get comfortable.  See me again in 4 weeks.

## 2013-10-19 NOTE — Assessment & Plan Note (Signed)
Decision today to treat with OMT was based on Physical Exam  After verbal consent patient was treated with HVLA, ME techniques in cervical, thoracic lumbar and sacral areas  Patient tolerated the procedure well with improvement in symptoms  Patient given exercises, stretches and lifestyle modifications  See medications in patient instructions if given  Patient will follow up in 4-6 weeks      

## 2013-10-20 ENCOUNTER — Encounter (INDEPENDENT_AMBULATORY_CARE_PROVIDER_SITE_OTHER): Payer: BC Managed Care – PPO | Admitting: Physical Therapy

## 2013-10-20 DIAGNOSIS — M6281 Muscle weakness (generalized): Secondary | ICD-10-CM

## 2013-10-20 DIAGNOSIS — IMO0001 Reserved for inherently not codable concepts without codable children: Secondary | ICD-10-CM

## 2013-10-20 DIAGNOSIS — M25559 Pain in unspecified hip: Secondary | ICD-10-CM

## 2013-10-20 DIAGNOSIS — G8929 Other chronic pain: Secondary | ICD-10-CM

## 2013-10-20 DIAGNOSIS — M545 Low back pain, unspecified: Secondary | ICD-10-CM

## 2013-10-20 DIAGNOSIS — M533 Sacrococcygeal disorders, not elsewhere classified: Secondary | ICD-10-CM

## 2013-11-04 ENCOUNTER — Ambulatory Visit (INDEPENDENT_AMBULATORY_CARE_PROVIDER_SITE_OTHER): Payer: BC Managed Care – PPO | Admitting: Family Medicine

## 2013-11-04 VITALS — Ht 69.0 in | Wt 191.1 lb

## 2013-11-04 DIAGNOSIS — Z6828 Body mass index (BMI) 28.0-28.9, adult: Secondary | ICD-10-CM | POA: Diagnosis not present

## 2013-11-04 DIAGNOSIS — K760 Fatty (change of) liver, not elsewhere classified: Secondary | ICD-10-CM

## 2013-11-04 DIAGNOSIS — K7689 Other specified diseases of liver: Secondary | ICD-10-CM | POA: Diagnosis not present

## 2013-11-04 NOTE — Progress Notes (Signed)
Patient ID: Yolanda RoysChristine Cooper, female   DOB: 09-10-1975, 38 y.o.   MRN: [id number]  Nutrition Clinic Visit  Pt seen in nutrition clinic with Dr. Gerilyn PilgrimSykes. Pt presents to discuss nutrition in light of being overweight.  Goals: at last visit were to exercise 5 minutes per day, 5 days a week, and have no caffeine after 1pm.  Joined weightwatchers Had a job interview and new haircut Has been seeing Dr. Katrinka BlazingSmith for back pain Daughter now sleeps in her own bed  Feelings list from last night (ate [place]):  -She felt: exasperated, lonely -She wanted to feel: balanced, content, nurtured, peaceful -What she actually needed: nurturing, stability  Things that use up her willpower: sleep deprivation, household responsibilities, bills, raising children, job  Has been taking a lot of caffeine.  Recommendations: 1. Find ways of being nurtured and make these a priority: massage, friends 2. Commit to using the three questions. 3. Try taking the hydroxyzine before going to bed at night as needed in order to get sleep. 4. Continue to follow up with Dr. Katrinka BlazingSmith about your back. Ask him about the turmeric. 5. If it's after 3pm, only have decaffeinated beverages. 6. Do your back rehab exercises five times per week.   Follow up in nutrition clinic with Dr. Gerilyn PilgrimSykes and myself on September 3.  Levert FeinsteinBrittany Nisa Decaire, MD Family Medicine PGY-3   30 minutes of direct face-to-face time was spent with the patient with at least 50% of this time spent counseling the patient.

## 2013-11-04 NOTE — Patient Instructions (Addendum)
Recommendations: 1. Find ways of being nurtured and make these a priority: massage, friends 2. Commit to using the three questions. 3. Try taking the hydroxyzine before going to bed at night as needed in order to get sleep. 4. Continue to follow up with Dr. Katrinka BlazingSmith about your back. Ask him about the turmeric. 5. If it's after 3pm, only have decaffeinated beverages. 6. Do your back rehab exercises five times per week.   Ask yourself the three questions: 1. How do I feel? 2. How do I want to feel? 3. What do I need now?  If you can't answer the questions at the time of the eating decisions, ask them of yourself later on. Write down your answers.

## 2013-11-17 ENCOUNTER — Ambulatory Visit: Payer: BC Managed Care – PPO | Admitting: Family Medicine

## 2013-12-21 ENCOUNTER — Encounter: Payer: Self-pay | Admitting: Family Medicine

## 2013-12-21 ENCOUNTER — Ambulatory Visit (INDEPENDENT_AMBULATORY_CARE_PROVIDER_SITE_OTHER): Payer: BC Managed Care – PPO | Admitting: Family Medicine

## 2013-12-21 VITALS — BP 126/82 | HR 85 | Ht 69.0 in | Wt 192.0 lb

## 2013-12-21 DIAGNOSIS — M9981 Other biomechanical lesions of cervical region: Secondary | ICD-10-CM

## 2013-12-21 DIAGNOSIS — F4323 Adjustment disorder with mixed anxiety and depressed mood: Secondary | ICD-10-CM

## 2013-12-21 DIAGNOSIS — M533 Sacrococcygeal disorders, not elsewhere classified: Secondary | ICD-10-CM

## 2013-12-21 DIAGNOSIS — M999 Biomechanical lesion, unspecified: Secondary | ICD-10-CM

## 2013-12-21 NOTE — Assessment & Plan Note (Signed)
In addition this remind patient that she does have hydroxyzine for breakthrough.

## 2013-12-21 NOTE — Patient Instructions (Addendum)
Good to see you Meloxicam daily for 3 days Hydroxyzine at night.  Continue the vitamins .  Then consider meloxicam 1-2 days before period and 1-2 days after.  Continue the exercises On wall heels, butt shoulder and head touching for 5 minutes daily.  See you again in 6 weeks.

## 2013-12-21 NOTE — Assessment & Plan Note (Signed)
The patient did have an exacerbation overall. Review patient's x-rays again the lumbar spine which are completely unremarkable. We discussed icing as well as home exercise program that is going to be more beneficial. Patient was given more postural exercises I think will be helpful as well. We discussed proper shoe wear they could also show some benefit. Patient is going to try these different changes and come back and see me again in 4-5 weeks for further evaluation and treatment.  Spent greater than 25 minutes with patient face-to-face and had greater than 50% of counseling including as described above in assessment and plan.

## 2013-12-21 NOTE — Progress Notes (Signed)
  [name] [address] [place], [place] [address] Phone: [phone] Subjective:     CC: back pain follow up   BJY:NWGNFAOZHY Yolanda Cooper is a 38 y.o. female coming in with complaint of back pain. Patient was diagnosed with piriformis syndrome as well as sacroiliac joint dysfunction. Patient was doing significantly better with conservative therapy but over the course last several weeks she is having increasing pain again. Patient has been driving a significant amount more insula started which is decreased patient's routine at this time which hasn't made her a little bit more anxious. Patient is not taking any medicines. Patient states that it's more pain it seems to be localized around the sacroiliac joint. Denies any significant radiation he currently numbness or tingling.     Past medical history, social, surgical and family history all reviewed in electronic medical record.   Review of Systems: No headache, visual changes, nausea, vomiting, diarrhea, constipation, dizziness, abdominal pain, skin rash, fevers, chills, night sweats, weight loss, swollen lymph nodes, body aches, joint swelling, muscle aches, chest pain, shortness of breath, mood changes.   Objective Blood pressure 126/82, pulse 85, height  (1.753 m), weight 192 lb (87.091 kg), SpO2 99.00%.  General: No apparent distress the patient is anxious alert and oriented x3 mood and affect normal, dressed appropriately.  HEENT: Pupils equal, extraocular movements intact  Respiratory: Patient's speak in full sentences and does not appear short of breath  Cardiovascular: No lower extremity edema, non tender, no erythema  Skin: Warm dry intact with no signs of infection or rash on extremities or on axial skeleton.  Abdomen: Soft nontender  Neuro: Cranial nerves II through XII are intact, neurovascularly intact in all extremities with 2+ DTRs and 2+ pulses.  Lymph: No lymphadenopathy of  posterior or anterior cervical chain or axillae bilaterally.  Gait normal with good balance and coordination.  MSK:  Non tender with full range of motion and good stability and symmetric strength and tone of shoulders, elbows, wrist, hip, knee and ankles bilaterally. Poor course strength Back Exam:  Inspection: Unremarkable  Motion: Flexion 45 deg, Extension 45 deg, Side Bending to 45 deg bilaterally,  Rotation to 45 deg bilaterally  SLR laying: Negative  XSLR laying: Negative  Palpable tenderness: Continued tenderness over the right SI joint worse than previous exam FABER: Positive right.  Sensory change: Gross sensation intact to all lumbar and sacral dermatomes.  Reflexes: 2+ at both patellar tendons, 2+ at achilles tendons, Babinski's downgoing.  Strength at foot  Plantar-flexion: 5/5 Dorsi-flexion: 5/5 Eversion: 5/5 Inversion: 5/5  Leg strength  Quad: 5/5 Hamstring: 5/5 Hip flexor: 5/5 Hip abductors: 4/5 no improvement Gait unremarkable.   OMT Physical Exam  Cervical  C4 flexed rotated and side bent right   Thoracic T5 extended rotated and side bent left T3 extended rotated and side bent right  Lumbar L2 flexed rotated inside that right  Sacrum Left on left    Impression and Recommendations:     This case required medical decision making of moderate complexity.

## 2013-12-21 NOTE — Assessment & Plan Note (Signed)
Decision today to treat with OMT was based on Physical Exam  After verbal consent patient was treated with HVLA, ME techniques in cervical, thoracic lumbar and sacral areas  Patient tolerated the procedure well with improvement in symptoms  Patient given exercises, stretches and lifestyle modifications  See medications in patient instructions if given  Patient will follow up in 4-6 weeks      

## 2013-12-23 ENCOUNTER — Ambulatory Visit: Payer: BC Managed Care – PPO | Admitting: Family Medicine

## 2014-01-26 ENCOUNTER — Ambulatory Visit (INDEPENDENT_AMBULATORY_CARE_PROVIDER_SITE_OTHER): Payer: BC Managed Care – PPO | Admitting: Family Medicine

## 2014-01-26 ENCOUNTER — Encounter: Payer: Self-pay | Admitting: Family Medicine

## 2014-01-26 VITALS — BP 132/82 | HR 87 | Ht 69.0 in | Wt 193.0 lb

## 2014-01-26 DIAGNOSIS — M999 Biomechanical lesion, unspecified: Secondary | ICD-10-CM

## 2014-01-26 DIAGNOSIS — M545 Low back pain, unspecified: Secondary | ICD-10-CM

## 2014-01-26 DIAGNOSIS — K760 Fatty (change of) liver, not elsewhere classified: Secondary | ICD-10-CM

## 2014-01-26 DIAGNOSIS — M9901 Segmental and somatic dysfunction of cervical region: Secondary | ICD-10-CM

## 2014-01-26 DIAGNOSIS — M9903 Segmental and somatic dysfunction of lumbar region: Secondary | ICD-10-CM

## 2014-01-26 DIAGNOSIS — M9902 Segmental and somatic dysfunction of thoracic region: Secondary | ICD-10-CM

## 2014-01-26 NOTE — Assessment & Plan Note (Signed)
Patient is back pain is multifactorial. Patient has not been doing exercises on a regular basis. Discussed with him the importance of course strength and watching patient's weight. Patient is going to continue with these home exercises on a regular basis which I think will be better. We discussed continuing the natural supplementations. I do think that patient's anxiety overall does cause some muscle tightness and we discussed using the hydroxyzine as needed. Patient and will come back and see me again in 4 weeks for further evaluation and treatment.  Spent greater than 25 minutes with patient face-to-face and had greater than 50% of counseling including as described above in assessment and plan.

## 2014-01-26 NOTE — Progress Notes (Signed)
  Tawana ScaleZach Sakinah Rosamond D.O. [facility] [address] [place], [place] [address] Phone: [phone] Subjective:     CC: back pain follow up   BJY:NWGNFAOZHYHPI:Subjective Yolanda RoysChristine Cooper is a 38 y.o. female coming in with complaint of back pain. Patient was diagnosed with piriformis syndrome as well as sacroiliac joint dysfunction. Patient states that the pain seems to be less and less and duration. Patient does have some mild thoracic pain that is somewhat new. Patient does have a history of fatty liver disease and is more concerned with this pain.  Patient does have a significant amount of anxiety. She was given some hydroxyzine to try to help. Patient states it did make her tired but she has not tried a half dose. Patient states that just made her feel groggy in the morning.     Past medical history, social, surgical and family history all reviewed in electronic medical record.   Review of Systems: No headache, visual changes, nausea, vomiting, diarrhea, constipation, dizziness, abdominal pain, skin rash, fevers, chills, night sweats, weight loss, swollen lymph nodes, body aches, joint swelling, muscle aches, chest pain, shortness of breath, mood changes.   Objective Blood pressure 132/82, pulse 87, height 5\' 9"  (1.753 m), weight 193 lb (87.544 kg), SpO2 98.00%.  General: No apparent distress the patient is anxious alert and oriented x3 mood and affect normal, dressed appropriately.  HEENT: Pupils equal, extraocular movements intact  Respiratory: Patient's speak in full sentences and does not appear short of breath  Cardiovascular: No lower extremity edema, non tender, no erythema  Skin: Warm dry intact with no signs of infection or rash on extremities or on axial skeleton.  Abdomen: Soft nontender  Neuro: Cranial nerves II through XII are intact, neurovascularly intact in all extremities with 2+ DTRs and 2+ pulses.  Lymph: No lymphadenopathy of posterior or anterior cervical chain or  axillae bilaterally.  Gait normal with good balance and coordination.  MSK:  Non tender with full range of motion and good stability and symmetric strength and tone of shoulders, elbows, wrist, hip, knee and ankles bilaterally. Poor course strength Back Exam:  Inspection: Unremarkable  Motion: Flexion 45 deg, Extension 45 deg, Side Bending to 45 deg bilaterally,  Rotation to 45 deg bilaterally  SLR laying: Negative  XSLR laying: Negative  Palpable tenderness: Continued tenderness over the right SI joint worse than previous exam FABER: Positive right.  Sensory change: Gross sensation intact to all lumbar and sacral dermatomes.  Reflexes: 2+ at both patellar tendons, 2+ at achilles tendons, Babinski's downgoing.  Strength at foot  Plantar-flexion: 5/5 Dorsi-flexion: 5/5 Eversion: 5/5 Inversion: 5/5  Leg strength  Quad: 5/5 Hamstring: 5/5 Hip flexor: 5/5 Hip abductors: 4/5 no improvement Gait unremarkable.   OMT Physical Exam  Cervical  C4 flexed rotated and side bent right   Thoracic T5 extended rotated and side bent left T3 extended rotated and side bent right with inhaled rib  Lumbar L2 flexed rotated inside that right  Sacrum Left on left    Impression and Recommendations:     This case required medical decision making of moderate complexity.

## 2014-01-26 NOTE — Assessment & Plan Note (Signed)
Decision today to treat with OMT was based on Physical Exam  After verbal consent patient was treated with HVLA, ME techniques in cervical, thoracic lumbar and sacral areas  Patient tolerated the procedure well with improvement in symptoms  Patient given exercises, stretches and lifestyle modifications  See medications in patient instructions if given  F/u in 4 weeks.   Patient will follow up in 4-6 weeks

## 2014-01-26 NOTE — Patient Instructions (Signed)
You are doing great overall.   Try 1/2 tab of hydroxyzine when you need it.  Continue the exercises regularly.  Stand on the wall  See me again in 4 weeks.

## 2014-02-04 ENCOUNTER — Other Ambulatory Visit: Payer: Self-pay

## 2014-02-07 ENCOUNTER — Other Ambulatory Visit (INDEPENDENT_AMBULATORY_CARE_PROVIDER_SITE_OTHER): Payer: BC Managed Care – PPO

## 2014-02-07 DIAGNOSIS — K760 Fatty (change of) liver, not elsewhere classified: Secondary | ICD-10-CM

## 2014-02-07 LAB — CBC WITH DIFFERENTIAL/PLATELET
BASOS PCT: 0.6 % (ref 0.0–3.0)
Basophils Absolute: 0 10*3/uL (ref 0.0–0.1)
Eosinophils Absolute: 0.2 10*3/uL (ref 0.0–0.7)
Eosinophils Relative: 1.8 % (ref 0.0–5.0)
HCT: 41.9 % (ref 36.0–46.0)
Hemoglobin: 13.8 g/dL (ref 12.0–15.0)
LYMPHS PCT: 31.3 % (ref 12.0–46.0)
Lymphs Abs: 2.7 10*3/uL (ref 0.7–4.0)
MCHC: 32.9 g/dL (ref 30.0–36.0)
MCV: 86.2 fl (ref 78.0–100.0)
Monocytes Absolute: 0.7 10*3/uL (ref 0.1–1.0)
Monocytes Relative: 8.7 % (ref 3.0–12.0)
NEUTROS PCT: 57.6 % (ref 43.0–77.0)
Neutro Abs: 4.9 10*3/uL (ref 1.4–7.7)
Platelets: 335 10*3/uL (ref 150.0–400.0)
RBC: 4.86 Mil/uL (ref 3.87–5.11)
RDW: 13.8 % (ref 11.5–15.5)
WBC: 8.5 10*3/uL (ref 4.0–10.5)

## 2014-02-07 LAB — COMPREHENSIVE METABOLIC PANEL
ALT: 70 U/L — ABNORMAL HIGH (ref 0–35)
AST: 35 U/L (ref 0–37)
Albumin: 3.7 g/dL (ref 3.5–5.2)
Alkaline Phosphatase: 73 U/L (ref 39–117)
BUN: 12 mg/dL (ref 6–23)
CALCIUM: 9.3 mg/dL (ref 8.4–10.5)
CHLORIDE: 106 meq/L (ref 96–112)
CO2: 26 meq/L (ref 19–32)
Creatinine, Ser: 0.7 mg/dL (ref 0.4–1.2)
GFR: 101.81 mL/min (ref >60.00)
Glucose, Bld: 98 mg/dL (ref 70–99)
POTASSIUM: 4.3 meq/L (ref 3.5–5.1)
Sodium: 140 mEq/L (ref 135–145)
Total Bilirubin: 0.7 mg/dL (ref 0.2–1.2)
Total Protein: 7.7 g/dL (ref 6.0–8.3)

## 2014-02-07 LAB — LIPID PANEL
CHOLESTEROL: 126 mg/dL (ref 0–200)
HDL: 26.3 mg/dL — AB (ref >39.00)
LDL Cholesterol: 80 mg/dL (ref 0–99)
NonHDL: 99.7
TRIGLYCERIDES: 98 mg/dL (ref 0.0–149.0)
Total CHOL/HDL Ratio: 5
VLDL: 19.6 mg/dL (ref 0.0–40.0)

## 2014-02-07 LAB — TSH: TSH: 2.45 u[IU]/mL (ref 0.35–4.50)

## 2014-02-07 LAB — VITAMIN D 25 HYDROXY (VIT D DEFICIENCY, FRACTURES): VITD: 19.99 ng/mL — AB (ref 30.00–100.00)

## 2014-02-08 ENCOUNTER — Encounter: Payer: Self-pay | Admitting: Family Medicine

## 2014-02-23 ENCOUNTER — Ambulatory Visit (INDEPENDENT_AMBULATORY_CARE_PROVIDER_SITE_OTHER): Payer: BC Managed Care – PPO | Admitting: Family Medicine

## 2014-02-23 ENCOUNTER — Encounter: Payer: Self-pay | Admitting: Family Medicine

## 2014-02-23 VITALS — BP 106/72 | HR 78 | Ht 69.0 in | Wt 190.0 lb

## 2014-02-23 DIAGNOSIS — M9902 Segmental and somatic dysfunction of thoracic region: Secondary | ICD-10-CM

## 2014-02-23 DIAGNOSIS — M533 Sacrococcygeal disorders, not elsewhere classified: Secondary | ICD-10-CM

## 2014-02-23 DIAGNOSIS — K760 Fatty (change of) liver, not elsewhere classified: Secondary | ICD-10-CM

## 2014-02-23 DIAGNOSIS — M545 Low back pain, unspecified: Secondary | ICD-10-CM

## 2014-02-23 DIAGNOSIS — M999 Biomechanical lesion, unspecified: Secondary | ICD-10-CM

## 2014-02-23 DIAGNOSIS — M9903 Segmental and somatic dysfunction of lumbar region: Secondary | ICD-10-CM

## 2014-02-23 DIAGNOSIS — M9901 Segmental and somatic dysfunction of cervical region: Secondary | ICD-10-CM

## 2014-02-23 MED ORDER — ACYCLOVIR 400 MG PO TABS: 400.0000 mg | ORAL_TABLET | Freq: Three times a day (TID) | ORAL | Status: DC

## 2014-02-23 NOTE — Progress Notes (Signed)
Yolanda ScaleZach Cooper D.O. [facility] [address] [place], [place] [address] Phone: [phone] Subjective:     CC: back pain follow up   UVO:ZDGUYQIHKVHPI:Subjective Yolanda Cooper is a 38 y.o. female coming in with complaint of back pain. Patient was diagnosed with piriformis syndrome as well as sacroiliac joint dysfunction. Patient has been responding to OMT.  Patient states that over the course last week it is starting to slowly start coming back. Patient states that it is more of a dull aching pain that is not stopping her from any activity. Patient continues to over-the-counter medications. Patient has not needed to use any ibuprofen at this time. Patient has noticed that she has had some soreness more on her side.  Lumbar x-rays are completely unremarkable taken April 2015  Patient does have a significant amount of anxiety. She was given some hydroxyzine to try to help. Patient does have difficulty staying awake with full dose of hydroxyzine but was to start half the dose.patient has not used the medication at this time.  Patient does have a history of fatty liver disease and is concerned because she had an increase in her a LT. Discussed with patient we will check in 12 weeks.     Past medical history, social, surgical and family history all reviewed in electronic medical record.   Review of Systems: No headache, visual changes, nausea, vomiting, diarrhea, constipation, dizziness, abdominal pain, skin rash, fevers, chills, night sweats, weight loss, swollen lymph nodes, body aches, joint swelling, muscle aches, chest pain, shortness of breath, mood changes.   Objective Blood pressure 106/72, pulse 78, height 5\' 9"  (1.753 m), weight 190 lb (86.183 kg), SpO2 97 %.  General: No apparent distress the patient is anxious alert and oriented x3 mood and affect normal, dressed appropriately.  HEENT: Pupils equal, extraocular movements intact  Respiratory: Patient's speak in full sentences  and does not appear short of breath  Cardiovascular: No lower extremity edema, non tender, no erythema  Skin: Warm dry intact with no signs of infection or rash on extremities or on axial skeleton.  Abdomen: Soft nontender  Neuro: Cranial nerves II through XII are intact, neurovascularly intact in all extremities with 2+ DTRs and 2+ pulses.  Lymph: No lymphadenopathy of posterior or anterior cervical chain or axillae bilaterally.  Gait normal with good balance and coordination.  MSK:  Non tender with full range of motion and good stability and symmetric strength and tone of shoulders, elbows, wrist, hip, knee and ankles bilaterally. Poor course strength Back Exam:  Inspection: inspection shows the patient does have a St. Rash that seems to be going within the T11 dermatome. Perry sensitive to palpation.  Motion: Flexion 45 deg, Extension 45 deg, Side Bending to 45 deg bilaterally,  Rotation to 45 deg bilaterally  SLR laying: Negative  XSLR laying: Negative  Palpable tenderness: Continued tenderness over the right SI joint worse than previous exam FABER: Positive right.  Sensory change: Gross sensation intact to all lumbar and sacral dermatomes.  Reflexes: 2+ at both patellar tendons, 2+ at achilles tendons, Babinski's downgoing.  Strength at foot  Plantar-flexion: 5/5 Dorsi-flexion: 5/5 Eversion: 5/5 Inversion: 5/5  Leg strength  Quad: 5/5 Hamstring: 5/5 Hip flexor: 5/5 Hip abductors: 4/5 no improvement Gait unremarkable.   OMT Physical Exam  Cervical  C4 flexed rotated and side bent right   Thoracic T5 extended rotated and side bent left T3 extended rotated and side bent right with inhaled rib  Lumbar L2 flexed rotated  inside that right  Sacrum Left on left Significant change from previous exam.   Impression and Recommendations:     This case required medical decision making of moderate complexity.

## 2014-02-23 NOTE — Assessment & Plan Note (Signed)
Patient's pain is multifactorial continue to be so. Patient needs to continue to work on her anxiety as well as we discussed core strengthening exercises. Patient given more postural exercises that I think will be beneficial. Patient will try these interventions and come back again in 4 weeks. Patient has been responding very well to osteopathic manipulation.

## 2014-02-23 NOTE — Assessment & Plan Note (Signed)
Patient continues to work on her routine as well as the sacroiliac joint dysfunction. Encourage patient to continue to stay active and continue to work on weight loss and core strengthening exercises. Patient has responded well to over-the-counter medications. Due to patient's underlying anxiety we discussed the possibility of formal physical therapy but due to time and financial constraints patient declined

## 2014-02-23 NOTE — Assessment & Plan Note (Signed)
Decision today to treat with OMT was based on Physical Exam  After verbal consent patient was treated with HVLA, ME techniques in cervical, thoracic lumbar and sacral areas  Patient tolerated the procedure well with improvement in symptoms  Patient given exercises, stretches and lifestyle modifications  See medications in patient instructions if given  F/u in 4 weeks.   Patient will follow up in 4 weeks

## 2014-02-23 NOTE — Patient Instructions (Addendum)
Good to see you as always.  Continue to monitor your diet.  I think you are doing great and monitoring your diet.  Fish oil 2-3 grams daily.  Eurcerin and Aveeno 2-3 times daily to skin Watch fo ra rash on the side of your body and if gets worse take the acyclovir.  See me again in 1 month.

## 2014-03-17 IMAGING — US US ABDOMEN COMPLETE
1 series · 14 of 25 positions shown · non-contrast
Comparison: 10/14/2007

CLINICAL DATA: Elevated liver function tests.  History of a
cholecystectomy.

COMPLETE ABDOMINAL ULTRASOUND

[Series 1: us abdomen complete · 0.35mm/px · 14 of 54 slices shown]
[im 1/54]
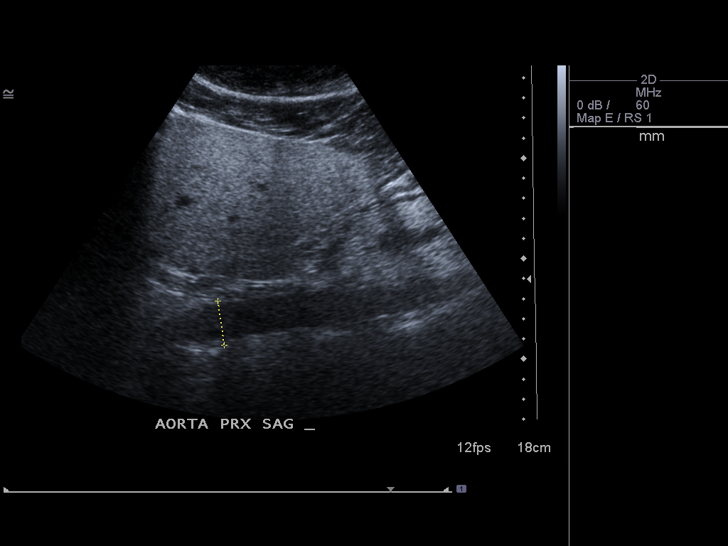
[im 5/54]
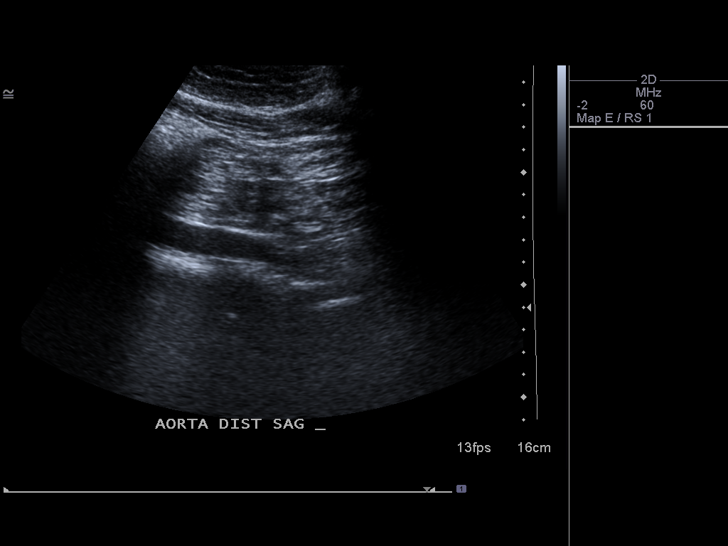
[im 9/54]
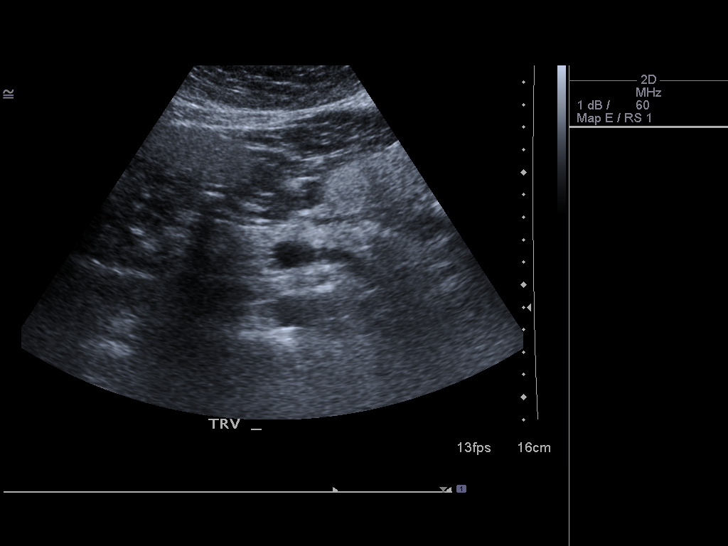
[im 14/54]
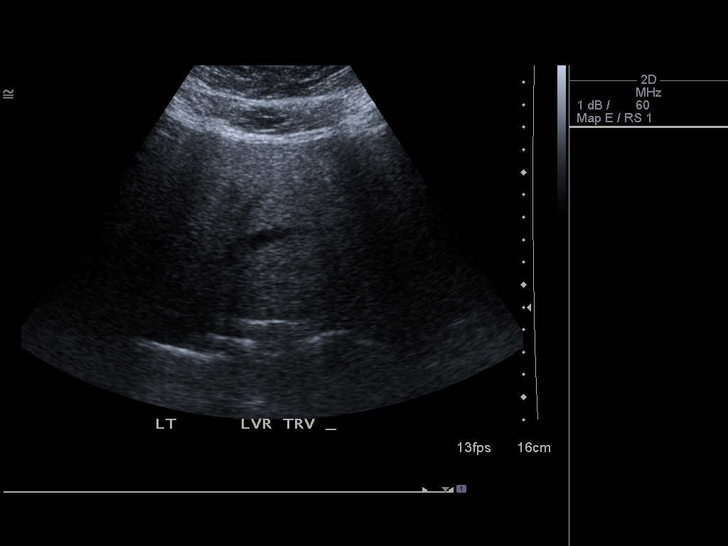
[im 18/54]
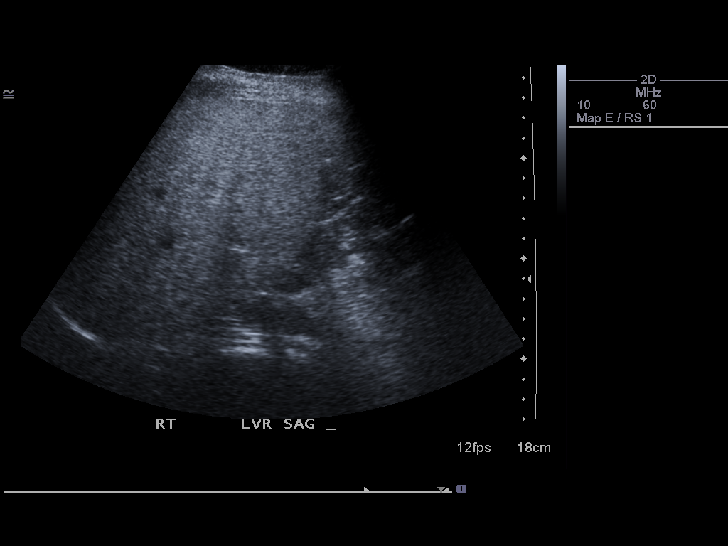
[im 20/54]
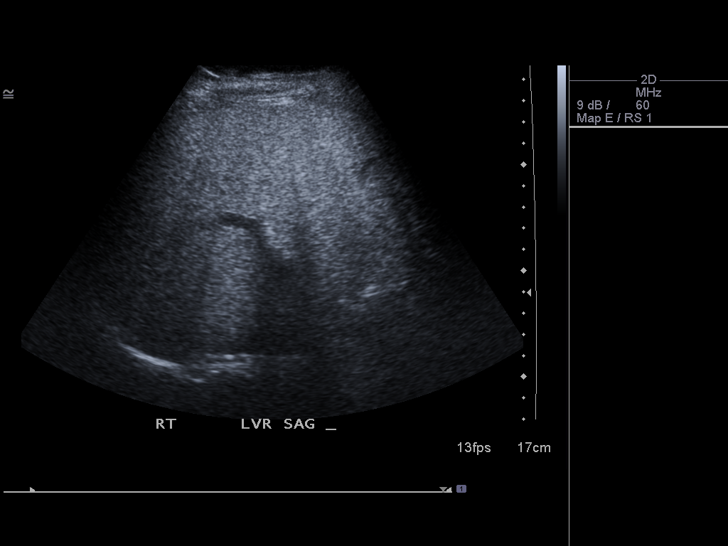
[im 25/54]
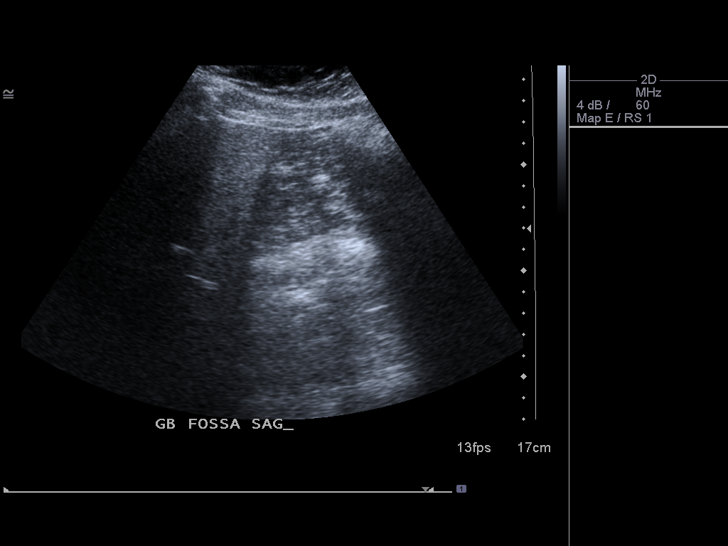
[im 29/54]
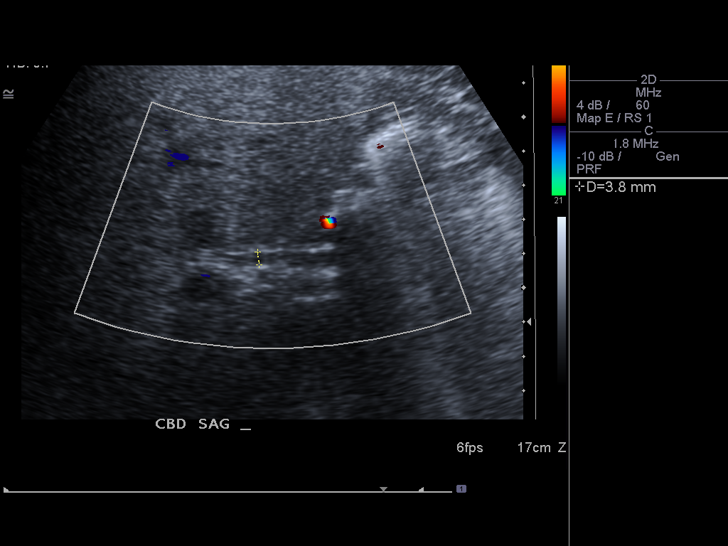
[im 34/54]
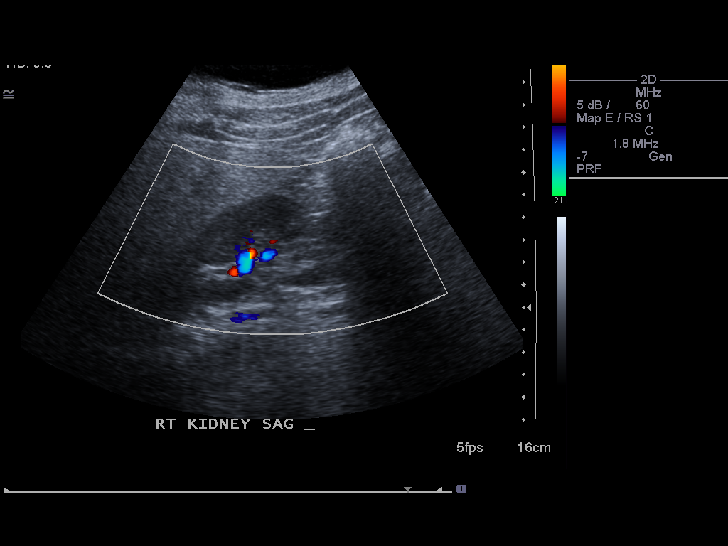
[im 36/54]
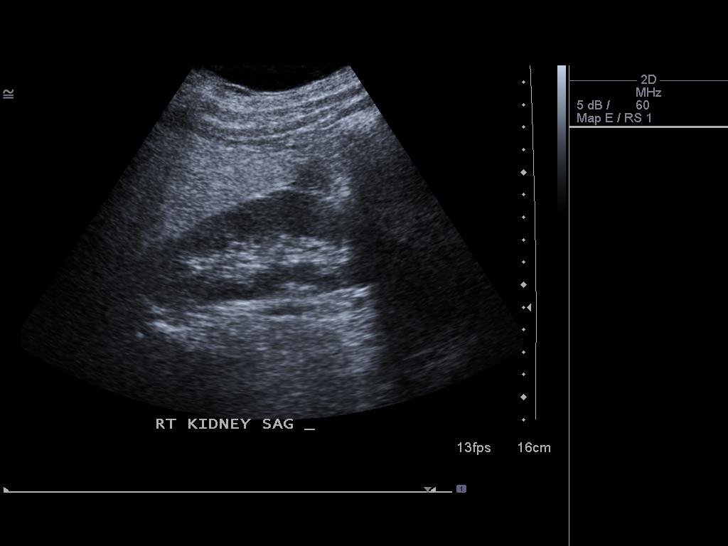
[im 40/54]
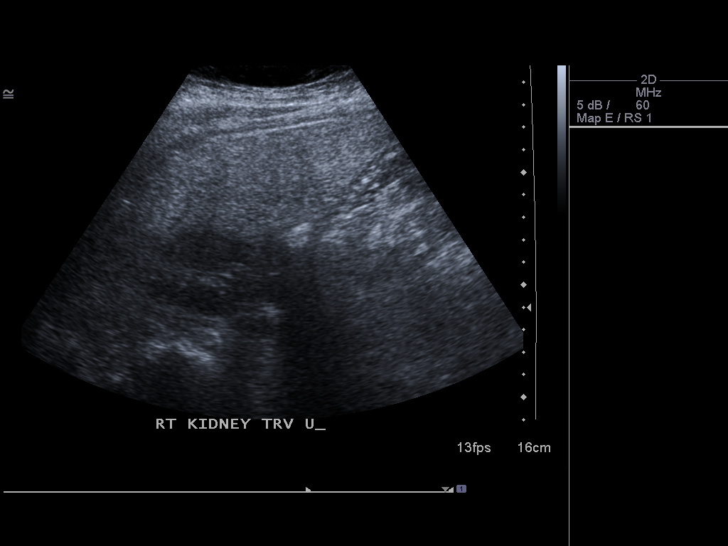
[im 45/54]
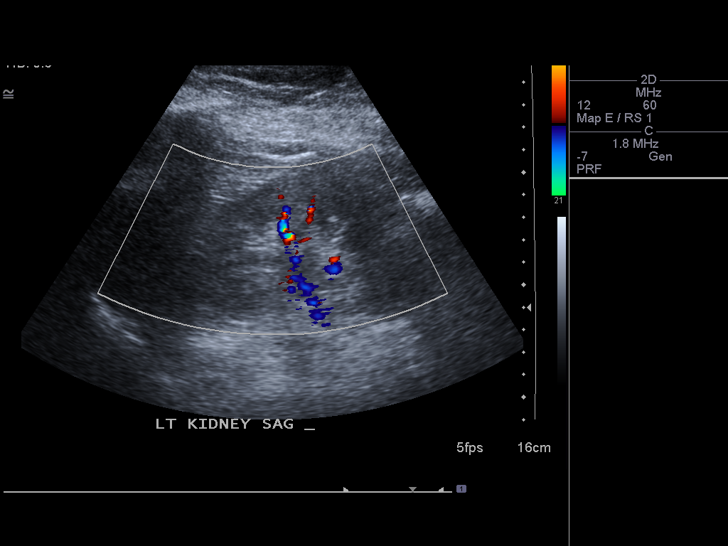
[im 49/54]
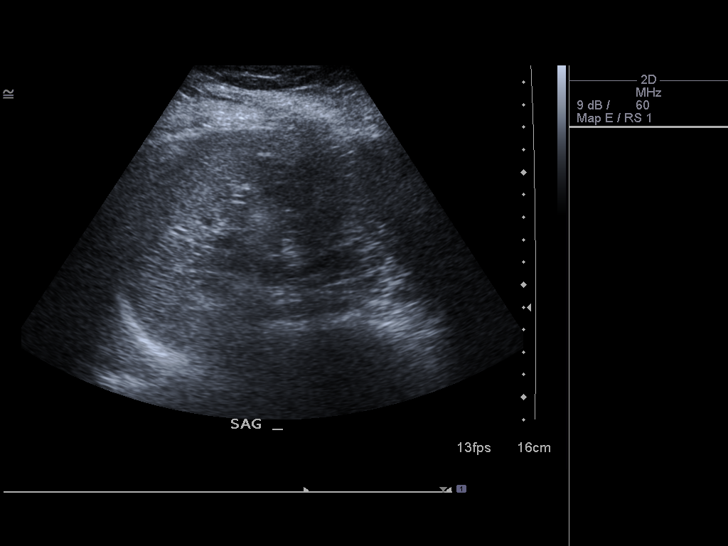
[im 54/54]
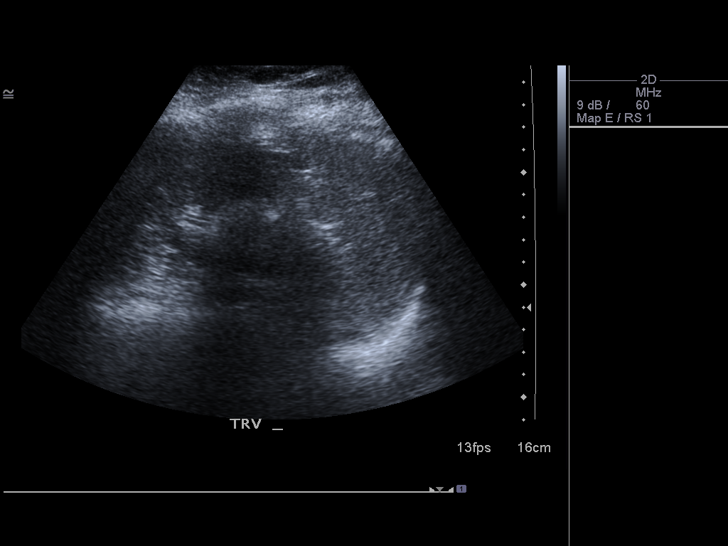

[14 of 25 positions shown; findings below may reference images not displayed]

FINDINGS: Gallbladder:  Surgically removed

Common bile duct:  No duct dilation or stones.  Common bile duct
measures 4 mm in diameter.

Liver:  The liver is echogenic consistent with diffuse fatty
infiltration.  It is normal in overall size.  No liver mass or
focal lesion.

IVC:  Portions of the inferior vena cava were obscured by bowel
gas.  The portions visualized are normal.

Pancreas:  Portions of the inferior pancreatic head and distal tail
were obscured by bowel gas.  The visualized portions are normal.

Spleen:  Normal measuring 6 cm.

Right Kidney:  Normal measuring 11.4 cm in length.

Left Kidney:  Normal measuring 10.9 cm in length.

Abdominal aorta:  The proximal and mid mid aorta was visualized and
is normal in caliber.  The distal aorta with partly obscured,  but
grossly normal in caliber.
IMPRESSION: No acute findings.

Fatty infiltration of the liver.

Status post cholecystectomy.  No bile duct dilation or stones.

## 2014-03-25 ENCOUNTER — Encounter: Payer: Self-pay | Admitting: Family Medicine

## 2014-03-25 ENCOUNTER — Ambulatory Visit (INDEPENDENT_AMBULATORY_CARE_PROVIDER_SITE_OTHER): Payer: BC Managed Care – PPO | Admitting: Family Medicine

## 2014-03-25 VITALS — BP 124/82 | HR 78 | Ht 69.0 in | Wt 187.0 lb

## 2014-03-25 DIAGNOSIS — M999 Biomechanical lesion, unspecified: Secondary | ICD-10-CM

## 2014-03-25 DIAGNOSIS — M533 Sacrococcygeal disorders, not elsewhere classified: Secondary | ICD-10-CM

## 2014-03-25 DIAGNOSIS — M9901 Segmental and somatic dysfunction of cervical region: Secondary | ICD-10-CM

## 2014-03-25 DIAGNOSIS — M9903 Segmental and somatic dysfunction of lumbar region: Secondary | ICD-10-CM

## 2014-03-25 DIAGNOSIS — M9902 Segmental and somatic dysfunction of thoracic region: Secondary | ICD-10-CM

## 2014-03-25 NOTE — Assessment & Plan Note (Signed)
Decision today to treat with OMT was based on Physical Exam  After verbal consent patient was treated with HVLA, ME techniques in cervical, thoracic lumbar and sacral areas  Patient tolerated the procedure well with improvement in symptoms  Patient given exercises, stretches and lifestyle modifications  See medications in patient instructions if given  F/u in 4 weeks.

## 2014-03-25 NOTE — Assessment & Plan Note (Signed)
Patient has not been compliant with conservative therapy at this time. We discussed the possibility of formal physical therapy which patient declined. Patient has not been doing the exercises religiously and we did state that this would be the most beneficial think patient can do.

## 2014-03-25 NOTE — Patient Instructions (Signed)
Verbal instructions given

## 2014-03-25 NOTE — Progress Notes (Signed)
  Tawana ScaleZach Brandun Pinn D.O. [facility] [address] [place], [place] [address] Phone: [phone] Subjective:     CC: back pain follow up   VQQ:VZDGLOVFIEHPI:Subjective Yolanda RoysChristine Cooper is a 38 y.o. female coming in with complaint of back pain. Patient was diagnosed with piriformis syndrome as well as sacroiliac joint dysfunction. Patient has been responding to OMT.  Overall feeling relatively normal. Has had some mild discomfort on the right side. States that it still worse when she is standing for a long amount of time. Denies any numbness or tingling denies any radiation to the extremity is. Patient puts the severity of pain is 6 out of 10. No new symptoms shows same previous symptoms.  Lumbar x-rays are completely unremarkable taken April 2015  Patient does have a significant amount of anxiety. She was given some hydroxyzine to try to help. Patient does have difficulty staying awake with full dose of hydroxyzine but was to start half the dose.patient has not used the medication at this time.     Past medical history, social, surgical and family history all reviewed in electronic medical record.   Review of Systems: No headache, visual changes, nausea, vomiting, diarrhea, constipation, dizziness, abdominal pain, skin rash, fevers, chills, night sweats, weight loss, swollen lymph nodes, body aches, joint swelling, muscle aches, chest pain, shortness of breath, mood changes.   Objective Blood pressure 124/82, pulse 78, height 5\' 9"  (1.753 m), weight 187 lb (84.823 kg), SpO2 97 %.  General: No apparent distress the patient is anxious alert and oriented x3 mood and affect normal, dressed appropriately.  HEENT: Pupils equal, extraocular movements intact  Respiratory: Patient's speak in full sentences and does not appear short of breath  Cardiovascular: No lower extremity edema, non tender, no erythema  Skin: Warm dry intact with no signs of infection or rash on extremities or on axial skeleton.    Abdomen: Soft nontender  Neuro: Cranial nerves II through XII are intact, neurovascularly intact in all extremities with 2+ DTRs and 2+ pulses.  Lymph: No lymphadenopathy of posterior or anterior cervical chain or axillae bilaterally.  Gait normal with good balance and coordination.  MSK:  Non tender with full range of motion and good stability and symmetric strength and tone of shoulders, elbows, wrist, hip, knee and ankles bilaterally. Poor course strength Back Exam:  Inspection: inspection shows the patient does have a St. Rash that seems to be going within the T11 dermatome. Perry sensitive to palpation.  Motion: Flexion 45 deg, Extension 45 deg, Side Bending to 45 deg bilaterally,  Rotation to 45 deg bilaterally  SLR laying: Negative  XSLR laying: Negative  Palpable tenderness: Continued tenderness over the right SI joint worse than previous exam FABER: Positive right.  Sensory change: Gross sensation intact to all lumbar and sacral dermatomes.  Reflexes: 2+ at both patellar tendons, 2+ at achilles tendons, Babinski's downgoing.  Strength at foot  Plantar-flexion: 5/5 Dorsi-flexion: 5/5 Eversion: 5/5 Inversion: 5/5  Leg strength  Quad: 5/5 Hamstring: 5/5 Hip flexor: 5/5 Hip abductors: 4/5 no improvement Gait unremarkable.   OMT Physical Exam  Cervical  C4 flexed rotated and side bent right   Thoracic T5 extended rotated and side bent left T3 extended rotated and side bent right with inhaled rib  Lumbar L2 flexed rotated inside that right  Sacrum Left on left Same as previous exam.   Impression and Recommendations:     This case required medical decision making of moderate complexity.

## 2014-05-11 ENCOUNTER — Ambulatory Visit (INDEPENDENT_AMBULATORY_CARE_PROVIDER_SITE_OTHER): Payer: BLUE CROSS/BLUE SHIELD | Admitting: Family Medicine

## 2014-05-11 ENCOUNTER — Encounter: Payer: Self-pay | Admitting: Family Medicine

## 2014-05-11 VITALS — BP 112/78 | HR 72 | Ht 69.0 in | Wt 189.0 lb

## 2014-05-11 DIAGNOSIS — M545 Low back pain, unspecified: Secondary | ICD-10-CM

## 2014-05-11 DIAGNOSIS — M9902 Segmental and somatic dysfunction of thoracic region: Secondary | ICD-10-CM

## 2014-05-11 DIAGNOSIS — M533 Sacrococcygeal disorders, not elsewhere classified: Secondary | ICD-10-CM

## 2014-05-11 DIAGNOSIS — G5701 Lesion of sciatic nerve, right lower limb: Secondary | ICD-10-CM

## 2014-05-11 DIAGNOSIS — M9901 Segmental and somatic dysfunction of cervical region: Secondary | ICD-10-CM

## 2014-05-11 DIAGNOSIS — M999 Biomechanical lesion, unspecified: Secondary | ICD-10-CM

## 2014-05-11 DIAGNOSIS — K760 Fatty (change of) liver, not elsewhere classified: Secondary | ICD-10-CM

## 2014-05-11 DIAGNOSIS — M9903 Segmental and somatic dysfunction of lumbar region: Secondary | ICD-10-CM

## 2014-05-11 NOTE — Assessment & Plan Note (Signed)

## 2014-05-11 NOTE — Progress Notes (Signed)
[name] [address] [place], [place] [address] Phone: [phone] Subjective:     CC: back pain follow up   BJY:[id number] Yolanda Cooper is a 39 y.o. female coming in with complaint of back pain. Patient was diagnosed with piriformis syndrome as well as sacroiliac joint dysfunction. Patient has been responding to OMT.  Patient has been responding very well. Patient continues he over-the-counter natural supplementations. Patient states overall she has noticed some increasing pain again over the course last 2 weeks. Patient states that the medication does help but then it seems to start coming back. Patient has not been doing anything new. Patient is still has not had the motivation of starting to increase her activity. Denies any new symptoms. States that the pain seems to be somewhat lower though in her buttocks region.  Patient also has had elevated liver enzymes previously and would like to have them recheck. Patient's ALT was 70, 3 months ago.  Lumbar x-rays are completely unremarkable taken April 2015     Past medical history, social, surgical and family history all reviewed in electronic medical record.   Review of Systems: No headache, visual changes, nausea, vomiting, diarrhea, constipation, dizziness, abdominal pain, skin rash, fevers, chills, night sweats, weight loss, swollen lymph nodes, body aches, joint swelling, muscle aches, chest pain, shortness of breath, mood changes.   Objective Blood pressure 112/78, pulse 72, height  (1.753 m), weight 189 lb (85.73 kg), SpO2 97 %.  General: No apparent distress the patient is anxious alert and oriented x3 mood and affect normal, dressed appropriately.  HEENT: Pupils equal, extraocular movements intact  Respiratory: Patient's speak in full sentences and does not appear short of breath  Cardiovascular: No lower extremity edema, non tender, no erythema  Skin: Warm dry intact with no signs  of infection or rash on extremities or on axial skeleton.  Abdomen: Soft nontender  Neuro: Cranial nerves II through XII are intact, neurovascularly intact in all extremities with 2+ DTRs and 2+ pulses.  Lymph: No lymphadenopathy of posterior or anterior cervical chain or axillae bilaterally.  Gait normal with good balance and coordination.  MSK:  Non tender with full range of motion and good stability and symmetric strength and tone of shoulders, elbows, wrist, hip, knee and ankles bilaterally. Poor course strength Back Exam:  Inspection: inspection shows the patient does have a St. Rash that seems to be going within the T11 dermatome. Perry sensitive to palpation.  Motion: Flexion 45 deg, Extension 45 deg, Side Bending to 45 deg bilaterally,  Rotation to 45 deg bilaterally  SLR laying: Negative  XSLR laying: Negative  Palpable tenderness: Continued tenderness over the right SI joint worse than previous exam increasing pain in piriformis muscle as well. FABER: Positive right.  Sensory change: Gross sensation intact to all lumbar and sacral dermatomes.  Reflexes: 2+ at both patellar tendons, 2+ at achilles tendons, Babinski's downgoing.  Strength at foot  Plantar-flexion: 5/5 Dorsi-flexion: 5/5 Eversion: 5/5 Inversion: 5/5  Leg strength  Quad: 5/5 Hamstring: 5/5 Hip flexor: 5/5 Hip abductors: 4/5 no improvement Gait unremarkable.   OMT Physical Exam  Cervical  C4 flexed rotated and side bent right   Thoracic T5 extended rotated and side bent left T3 extended rotated and side bent right with inhaled rib  Lumbar L2 flexed rotated inside that right  Sacrum Left on left   [id number]; 15 minutes spent for Therapeutic exercises as stated in above notes.  This included exercises focusing  on stretching, strengthening, with significant focus on eccentric aspects.   Proper technique shown and discussed handout in great detail with ATC.  All questions were discussed and answered.       Impression and Recommendations:     This case required medical decision making of moderate complexity.

## 2014-05-11 NOTE — Assessment & Plan Note (Signed)
Patient continues to have some dysfunction overall. Continues to fall to the right. This is secondary to tight hip flexors as well as recently a weaker hip abductors causing patient had more of a piriformis syndrome. Patient did respond well to manipulation. Patient has meloxicam as well as hydroxyzine to help with some anxiety. Patient come back in 4 weeks for further evaluation.

## 2014-05-11 NOTE — Patient Instructions (Addendum)
Great to see you.  Continue what you are doing.  Try to continue to focus on core strength.  We will focus on the piriformis muscle.  Ice when you need it Try to stay warm  See you again in 4 weeks

## 2014-05-11 NOTE — Assessment & Plan Note (Signed)
Decision today to treat with OMT was based on Physical Exam  After verbal consent patient was treated with HVLA, ME techniques in cervical, thoracic lumbar and sacral areas  Patient tolerated the procedure well with improvement in symptoms  Patient given exercises, stretches and lifestyle modifications  See medications in patient instructions if given  F/u in 4 weeks. Unable to space and out secondary to pain and likely some mild noncompliance.

## 2014-05-11 NOTE — Assessment & Plan Note (Signed)
Recheck liver enzymes today.  

## 2014-06-15 ENCOUNTER — Other Ambulatory Visit (INDEPENDENT_AMBULATORY_CARE_PROVIDER_SITE_OTHER): Payer: BLUE CROSS/BLUE SHIELD

## 2014-06-15 ENCOUNTER — Ambulatory Visit (INDEPENDENT_AMBULATORY_CARE_PROVIDER_SITE_OTHER): Payer: BLUE CROSS/BLUE SHIELD | Admitting: Family Medicine

## 2014-06-15 ENCOUNTER — Encounter: Payer: Self-pay | Admitting: Family Medicine

## 2014-06-15 VITALS — BP 122/82 | HR 73 | Ht 69.0 in | Wt 187.0 lb

## 2014-06-15 DIAGNOSIS — M999 Biomechanical lesion, unspecified: Secondary | ICD-10-CM

## 2014-06-15 DIAGNOSIS — M9901 Segmental and somatic dysfunction of cervical region: Secondary | ICD-10-CM

## 2014-06-15 DIAGNOSIS — K760 Fatty (change of) liver, not elsewhere classified: Secondary | ICD-10-CM

## 2014-06-15 DIAGNOSIS — M9903 Segmental and somatic dysfunction of lumbar region: Secondary | ICD-10-CM

## 2014-06-15 DIAGNOSIS — M533 Sacrococcygeal disorders, not elsewhere classified: Secondary | ICD-10-CM

## 2014-06-15 DIAGNOSIS — M9902 Segmental and somatic dysfunction of thoracic region: Secondary | ICD-10-CM

## 2014-06-15 LAB — HEPATIC FUNCTION PANEL
ALK PHOS: 74 U/L (ref 39–117)
ALT: 35 U/L (ref 0–35)
AST: 20 U/L (ref 0–37)
Albumin: 4.4 g/dL (ref 3.5–5.2)
BILIRUBIN DIRECT: 0.1 mg/dL (ref 0.0–0.3)
Total Bilirubin: 0.4 mg/dL (ref 0.2–1.2)
Total Protein: 7.7 g/dL (ref 6.0–8.3)

## 2014-06-15 NOTE — Progress Notes (Signed)
Pre visit review using our clinic review tool, if applicable. No additional management support is needed unless otherwise documented below in the visit note. 

## 2014-06-15 NOTE — Progress Notes (Signed)
  Tawana ScaleZach Smith D.O. [facility] [address] [place], [place] [address] Phone: [phone] Subjective:     CC: back pain follow up   EXB:[id number]:Subjective Yolanda RoysChristine Cooper is a 39 y.o. female coming in with complaint of back pain. Patient was diagnosed with piriformis syndrome as well as sacroiliac joint dysfunction. Patient has been responding to OMT.  She states overall she has been feeling significantly better. Patient so has some mild discomfort in the lower back but overall continues to do significant well. Patient denies that this is stopping her from any regular activities and is sleeping comfortably at night. Patient is in the process of ordering a new mattress.  Rechecked of liver enzymes are normal.  Lumbar x-rays are completely unremarkable taken April 2015 showed no significant findings.     Past medical history, social, surgical and family history all reviewed in electronic medical record.   Review of Systems: No headache, visual changes, nausea, vomiting, diarrhea, constipation, dizziness, abdominal pain, skin rash, fevers, chills, night sweats, weight loss, swollen lymph nodes, body aches, joint swelling, muscle aches, chest pain, shortness of breath, mood changes.   Objective Blood pressure 122/82, pulse 73, height 5\' 9"  (1.753 m), weight 187 lb (84.823 kg), SpO2 96 %.  General: No apparent distress the patient is anxious alert and oriented x3 mood and affect normal, dressed appropriately.  HEENT: Pupils equal, extraocular movements intact  Respiratory: Patient's speak in full sentences and does not appear short of breath  Cardiovascular: No lower extremity edema, non tender, no erythema  Skin: Warm dry intact with no signs of infection or rash on extremities or on axial skeleton.  Abdomen: Soft nontender  Neuro: Cranial nerves II through XII are intact, neurovascularly intact in all extremities with 2+ DTRs and 2+ pulses.  Lymph: No lymphadenopathy of  posterior or anterior cervical chain or axillae bilaterally.  Gait normal with good balance and coordination.  MSK:  Non tender with full range of motion and good stability and symmetric strength and tone of shoulders, elbows, wrist, hip, knee and ankles bilaterally. Poor course strength Back Exam:  Inspection no rash noted Motion: Flexion 45 deg, Extension 45 deg, Side Bending to 45 deg bilaterally,  Rotation to 45 deg bilaterally  SLR laying: Negative  XSLR laying: Negative  Palpable tenderness: Significant less tenderness than previous exam FABER: Positive right.  Sensory change: Gross sensation intact to all lumbar and sacral dermatomes.  Reflexes: 2+ at both patellar tendons, 2+ at achilles tendons, Babinski's downgoing.  Strength at foot  Plantar-flexion: 5/5 Dorsi-flexion: 5/5 Eversion: 5/5 Inversion: 5/5  Leg strength  Quad: 5/5 Hamstring: 5/5 Hip flexor: 5/5 Hip abductors: 4/5 with minimal  improvement Gait unremarkable.   OMT Physical Exam  Cervical  C4 flexed rotated and side bent right   Thoracic T5 extended rotated and side bent left T3 extended rotated and side bent right   Lumbar L2 flexed rotated inside that right  Sacrum Left on left     Impression and Recommendations:     This case required medical decision making of moderate complexity.

## 2014-06-15 NOTE — Patient Instructions (Addendum)
You are doing great!!!! Best you have been.  Continue the stretches.  Ice when you need it Continue the vitamins Lets do 5 weeks.

## 2014-06-15 NOTE — Assessment & Plan Note (Signed)
Patient is doing significantly better with her being more religious with the exercises. We discussed the icing regimen and the home exercises. We discussed continuing the vitamin supplementations. Patient does have some medications for any breakthrough pain. Patient will continue everything else that she is doing at this time and will follow-up with me in 5 weeks.

## 2014-06-15 NOTE — Assessment & Plan Note (Signed)
Decision today to treat with OMT was based on Physical Exam  After verbal consent patient was treated with HVLA, ME techniques in cervical, thoracic lumbar and sacral areas  Patient tolerated the procedure well with improvement in symptoms  Patient given exercises, stretches and lifestyle modifications  See medications in patient instructions if given  Patient out to 5 weeks.

## 2014-07-19 ENCOUNTER — Encounter: Payer: Self-pay | Admitting: *Deleted

## 2014-07-19 ENCOUNTER — Emergency Department
Admission: EM | Admit: 2014-07-19 | Discharge: 2014-07-19 | Disposition: A | Discharge status: Home / Self Care | Payer: BLUE CROSS/BLUE SHIELD | Source: Home / Self Care | Attending: Emergency Medicine | Admitting: Emergency Medicine

## 2014-07-19 DIAGNOSIS — R300 Dysuria: Secondary | ICD-10-CM

## 2014-07-19 DIAGNOSIS — M545 Low back pain: Secondary | ICD-10-CM | POA: Diagnosis not present

## 2014-07-19 DIAGNOSIS — M546 Pain in thoracic spine: Secondary | ICD-10-CM

## 2014-07-19 DIAGNOSIS — N3001 Acute cystitis with hematuria: Secondary | ICD-10-CM

## 2014-07-19 LAB — POCT URINALYSIS DIP (MANUAL ENTRY)
Bilirubin, UA: NEGATIVE
Glucose, UA: NEGATIVE
Ketones, POC UA: NEGATIVE
Leukocytes, UA: NEGATIVE
Nitrite, UA: NEGATIVE
Protein Ur, POC: NEGATIVE
Spec Grav, UA: 1.02 (ref 1.005–1.03)
Urobilinogen, UA: 0.2 (ref 0–1)
pH, UA: 5.5 (ref 5–8)

## 2014-07-19 MED ORDER — CIPROFLOXACIN HCL 250 MG PO TABS: 250.0000 mg | ORAL_TABLET | Freq: Two times a day (BID) | ORAL | Status: DC

## 2014-07-19 NOTE — ED Provider Notes (Signed)
CSN: [id number]     Arrival date & time 07/19/14  1101 History   First MD Initiated Contact with Patient 07/19/14 1112     Chief Complaint  Patient presents with  . Polyuria  . Back Pain   Mirren presents to [facility] HPI Allyssia c/o vague, occasional upper back pain, polyuria and malodorous urine x 2 weeks. She states she was seen one month ago at an urgent care in [facility], diagnosed with UTI  (she states culture grew out Proteus mirabilis) and treated with Cipro for a week and symptoms had resolved.   + dysuria + frequency + urgency No hematuria No vaginal discharge. Denies any GYN symptoms. No fever/chills She denies any abdominal pain, but states she had mild suprapubic pain. Mild nausea, not currently. No vomiting + back pain--right parathoracic pain that's mild without radiation. The past couple weeks, but she is vague about this. She also states that she's had chronic intermittent low back pain but that's not particularly flaring up now. No fatigue She denies chance of pregnancy. Has tried over-the-counter measures without improvement.  Denies bowel or bladder incontinence. No change of bowel habits. Denies HEENT or cardiorespiratory symptoms.  Denies history of recurrent UTIs over the years.  She admits that she tends to worry about things. She is vague about this, but states that she is fatigued all the time.    Past Medical History  Diagnosis Date  . Rosacea   . Anxiety   . Unspecified vitamin D deficiency   . Overweight   . Other chronic pain   . Nonspecific elevation of levels of transaminase and lactic acid dehydrogenase (LDH)   . Fatty liver    Past Surgical History  Procedure Laterality Date  . Cesarean section    . Cholecystectomy    . Wisdom tooth extraction     Family History  Problem Relation Age of Onset  . Diabetes Father   . Hyperlipidemia Father   . Hypertension Father   . Cancer Other     colon cancer   . Anuerysm  Other   . Diabetes Paternal Grandmother    History  Substance Use Topics  . Smoking status: Never Smoker   . Smokeless tobacco: Never Used  . Alcohol Use: Yes     Comment: Rarely    OB History    No data available     Review of Systems See details in history of present illness Allergies  Review of patient's allergies indicates no known allergies.  Home Medications   Prior to Admission medications   Medication Sig Start Date End Date Taking? Authorizing Provider  acyclovir (ZOVIRAX) 400 MG tablet Take 1 tablet (400 mg total) by mouth 3 (three) times daily. 02/23/14   Judi Saa, DO  buPROPion (WELLBUTRIN SR) 150 MG 12 hr tablet Take 1 tablet (150 mg total) by mouth 2 (two) times daily. 07/15/13 07/15/14  Gillian Scarce, MD  cycloSPORINE (RESTASIS) 0.05 % ophthalmic emulsion 1 drop 2 (two) times daily.    Historical Provider, MD  Doxycycline Hyclate 20 MG CAPS Take 20 mg by mouth 2 (two) times daily.    Historical Provider, MD  hydrOXYzine (ATARAX/VISTARIL) 25 MG tablet Take 1 tablet (25 mg total) by mouth 3 (three) times daily as needed. 08/27/13   Judi Saa, DO  ibuprofen (ADVIL,MOTRIN) 200 MG tablet Take 400 mg by mouth every 8 (eight) hours as needed for headache (She has not taken very much since her last office  visit.). PRN    Historical Provider, MD  Loteprednol Etabonate (LOTEMAX) 0.5 % GEL 1 drop. 02/07/13   Historical Provider, MD  meloxicam (MOBIC) 15 MG tablet Take 1 tablet (15 mg total) by mouth daily. 07/14/13   Judi Saa, DO  PROBIOTIC PRODUCT PO Take by mouth.    Historical Provider, MD  TURMERIC PO Take by mouth.    Historical Provider, MD   BP 127/73 mmHg  Pulse 82  Temp(Src) 98.3 F (36.8 C) (Oral)  Resp 14  Wt 185 lb (83.915 kg)  SpO2 98%  LMP 07/05/2014 Physical Exam  Constitutional: She is oriented to person, place, and time. She appears well-developed and well-nourished. No distress.  HENT:  Mouth/Throat: Oropharynx is clear and moist.    Eyes: No scleral icterus.  Neck: Neck supple.  Cardiovascular: Normal rate, regular rhythm and normal heart sounds.   Pulmonary/Chest: Breath sounds normal.  Abdominal: Soft. She exhibits no mass. There is no hepatosplenomegaly. There is tenderness (Minimal nonreproducible suprapubic tenderness). There is no rebound, no guarding and no CVA tenderness.  Musculoskeletal:       Lumbar back: She exhibits no bony tenderness.       Back:  Mild right paraspinal muscle tenderness and spasm. No thoracic spine tenderness or deformity.  No lumbar spine tenderness or deformity.  Lower extremities: Motor and sensory DTRs grossly intact and equal bilaterally.  Lymphadenopathy:    She has no cervical adenopathy.  Neurological: She is alert and oriented to person, place, and time.  Skin: Skin is warm and dry. No rash noted.  Psychiatric: She expresses no homicidal and no suicidal ideation.  Mildly anxious, no distress. She does repeat several times that she is worried about a lot of things.  Nursing note and vitals reviewed.   ED Course  Procedures (including critical care time) Labs Review Labs Reviewed  URINE CULTURE  POCT URINALYSIS DIP (MANUAL ENTRY)   Results for orders placed or performed during the hospital encounter of 07/19/14  POCT urinalysis dipstick (new)  Result Value Ref Range   Color, UA yellow    Clarity, UA clear    Glucose, UA neg    Bilirubin, UA negative    Bilirubin, UA negative    Spec Grav, UA 1.020 1.005 - 1.03   Blood, UA trace-lysed    pH, UA 5.5 5 - 8   Protein Ur, POC negative    Urobilinogen, UA 0.2 0 - 1   Nitrite, UA Negative    Leukocytes, UA Negative      Imaging Review No results found.   MDM   1. Acute cystitis with hematuria   2. Dysuria   3. Right-sided thoracic back pain    Over 25 minutes spent, greater than 50% of the time spent for counseling and coordination of care. Advised her that spasm and tenderness right parathoracic  muscles is likely not spinal, as she has no point tenderness over thoracic spine. Various nonpharmacologic measures discussed. For the acute cystitis, send off urine culture. Cipro 250 twice a day 7 days Push fluids and other symptomatic care discussed. She had numerous questions about fatigue and various musculoskeletal symptoms, and I answered as best I could, but advised her to follow-up with her PCP to evaluate fatigue and various other symptoms. Also advised her to have urinalysis rechecked when she follows up with PCP. Follow-up with your primary care doctor in 5-7 days if not improving, or sooner if symptoms become worse. Follow-up with Antoine Primas, DO, for  musculoskeletal problems, as she has been seeing him ongoing for various musculoskeletal problems Precautions discussed. Red flags discussed. Questions invited and answered. Patient voiced understanding and agreement.     Lajean Manesavid Massey, MD 07/19/14 [phone]

## 2014-07-19 NOTE — ED Notes (Signed)
Sylver c/o upper back pain, polyuria and malodorous urine x 2 weeks. Treated for UTI with Cipro 1 month ago. Symptoms resolved.

## 2014-07-20 LAB — URINE CULTURE
Colony Count: NO GROWTH
Organism ID, Bacteria: NO GROWTH

## 2014-07-21 ENCOUNTER — Telehealth: Payer: Self-pay | Admitting: Emergency Medicine

## 2014-07-22 ENCOUNTER — Telehealth: Payer: Self-pay | Admitting: Emergency Medicine

## 2014-07-22 NOTE — ED Notes (Signed)
Inquired about patient's status; encourage them to call with questions/concerns.  

## 2014-07-27 ENCOUNTER — Ambulatory Visit (INDEPENDENT_AMBULATORY_CARE_PROVIDER_SITE_OTHER): Payer: BLUE CROSS/BLUE SHIELD | Admitting: Family Medicine

## 2014-07-27 ENCOUNTER — Encounter: Payer: Self-pay | Admitting: Family Medicine

## 2014-07-27 VITALS — BP 98/64 | HR 97 | Ht 69.0 in | Wt 188.0 lb

## 2014-07-27 DIAGNOSIS — M533 Sacrococcygeal disorders, not elsewhere classified: Secondary | ICD-10-CM

## 2014-07-27 DIAGNOSIS — M999 Biomechanical lesion, unspecified: Secondary | ICD-10-CM

## 2014-07-27 DIAGNOSIS — M9902 Segmental and somatic dysfunction of thoracic region: Secondary | ICD-10-CM

## 2014-07-27 NOTE — Progress Notes (Signed)
  Yolanda ScaleZach Shaniah Cooper D.O. [facility] [address] [place], [place] [address] Phone: [phone](336) [phone] Subjective:     CC: back pain follow up   FAO:ZHYQMVHQIOHPI:Subjective Yolanda RoysChristine Cooper is a 39 y.o. female coming in with complaint of back pain. Patient was diagnosed with piriformis syndrome as well as sacroiliac joint dysfunction. Patient has been responding to OMT.   Patient was recently seen in the emergency department secondary to a urinary tract infection as well as pain in the back. Patient was given ciprofloxacin for her urinary tract infection. This was on 07/19/2014. Patient shearing culture did not grow any organism the patient did have trace hematuria. Patient states though that she is actually doing better overall. Patient has been doing the exercises more and attempting to lose weight. Patient states as long she continues to focus on strengthening the core this is been beneficial. Patient did get a new chair at work which is also very helpful. Patient denies any new symptoms. States just some mild discomfort from time to time.    Lumbar x-rays are completely unremarkable taken April 2015 showed no significant findings.     Past medical history, social, surgical and family history all reviewed in electronic medical record.   Review of Systems: No headache, visual changes, nausea, vomiting, diarrhea, constipation, dizziness, abdominal pain, skin rash, fevers, chills, night sweats, weight loss, swollen lymph nodes, body aches, joint swelling, muscle aches, chest pain, shortness of breath, mood changes.   Objective Blood pressure 98/64, pulse 97, height 5\' 9"  (1.753 m), weight 188 lb (85.276 kg), last menstrual period 07/05/2014, SpO2 98 %.  General: No apparent distress the patient is anxious alert and oriented x3 mood and affect normal, dressed appropriately.  HEENT: Pupils equal, extraocular movements intact  Respiratory: Patient's speak in full sentences and does not appear short  of breath  Cardiovascular: No lower extremity edema, non tender, no erythema  Skin: Warm dry intact with no signs of infection or rash on extremities or on axial skeleton.  Abdomen: Soft nontender  Neuro: Cranial nerves II through XII are intact, neurovascularly intact in all extremities with 2+ DTRs and 2+ pulses.  Lymph: No lymphadenopathy of posterior or anterior cervical chain or axillae bilaterally.  Gait normal with good balance and coordination.  MSK:  Non tender with full range of motion and good stability and symmetric strength and tone of shoulders, elbows, wrist, hip, knee and ankles bilaterally. Poor course strength Back Exam:  Inspection no rash noted Motion: Flexion 45 deg, Extension 45 deg, Side Bending to 45 deg bilaterally,  Rotation to 45 deg bilaterally  SLR laying: Negative  XSLR laying: Negative  Palpable tenderness: Really nontender on exam today. FABER: Positive right.  Sensory change: Gross sensation intact to all lumbar and sacral dermatomes.  Reflexes: 2+ at both patellar tendons, 2+ at achilles tendons, Babinski's downgoing.  Strength at foot  Plantar-flexion: 5/5 Dorsi-flexion: 5/5 Eversion: 5/5 Inversion: 5/5  Leg strength  Quad: 5/5 Hamstring: 5/5 Hip flexor: 5/5 Hip abductors: 4+/5 with continued minimal  improvement Gait unremarkable.   OMT Physical Exam  Cervical  C4 flexed rotated and side bent right  Thoracic T5 extended rotated and side bent left T3 extended rotated and side bent right   Lumbar L2 flexed rotated inside that right  Sacrum Left on left     Impression and Recommendations:     This case required medical decision making of moderate complexity.

## 2014-07-27 NOTE — Patient Instructions (Addendum)
Good to see you I think your back got a little worse from the infection/stone you had.  Continue the exercises Consider looking at a low oxalate diet and see if this helps.  Continue the vitamins Ice when you need it See me again in 5-6 weeks.

## 2014-07-27 NOTE — Assessment & Plan Note (Signed)
Decision today to treat with OMT was based on Physical Exam  After verbal consent patient was treated with HVLA, ME techniques in cervical, thoracic lumbar and sacral areas  Patient tolerated the procedure well with improvement in symptoms  Patient given exercises, stretches and lifestyle modifications  See medications in patient instructions if given  Patient out to 5-6 weeks.

## 2014-07-27 NOTE — Progress Notes (Signed)
Pre visit review using our clinic review tool, if applicable. No additional management support is needed unless otherwise documented below in the visit note. 

## 2014-07-27 NOTE — Assessment & Plan Note (Signed)
Patient continues to do very well with her range of motion exercises and is decreasing the amount of pain she is having in her back. Encourage her to continue the same exercises and we discussed different diet changes that can be beneficial. Encourage patient to continue to attempt weight loss. We discussed icing regimen and continuing the anti-inflammatories as needed. Patient and will follow-up with me again in 5-6 weeks for further evaluation and treatment.

## 2014-08-31 ENCOUNTER — Encounter: Payer: Self-pay | Admitting: Family Medicine

## 2014-08-31 ENCOUNTER — Ambulatory Visit (INDEPENDENT_AMBULATORY_CARE_PROVIDER_SITE_OTHER): Payer: BLUE CROSS/BLUE SHIELD | Admitting: Family Medicine

## 2014-08-31 VITALS — BP 106/72 | HR 92 | Ht 69.0 in | Wt 186.0 lb

## 2014-08-31 DIAGNOSIS — M533 Sacrococcygeal disorders, not elsewhere classified: Secondary | ICD-10-CM

## 2014-08-31 DIAGNOSIS — M9902 Segmental and somatic dysfunction of thoracic region: Secondary | ICD-10-CM

## 2014-08-31 DIAGNOSIS — M999 Biomechanical lesion, unspecified: Secondary | ICD-10-CM

## 2014-08-31 DIAGNOSIS — M9901 Segmental and somatic dysfunction of cervical region: Secondary | ICD-10-CM | POA: Diagnosis not present

## 2014-08-31 DIAGNOSIS — M9903 Segmental and somatic dysfunction of lumbar region: Secondary | ICD-10-CM | POA: Diagnosis not present

## 2014-08-31 NOTE — Assessment & Plan Note (Signed)
Unremarkable with better. Patient was given more postural changes. We discussed icing and continue topical anti-inflammatories. No worsening than previous. Patient continue with the conservative therapy and was given some mild changes and some of her exercises. Patient will come back again in 6 weeks for further evaluation and treatment.

## 2014-08-31 NOTE — Assessment & Plan Note (Signed)
Decision today to treat with OMT was based on Physical Exam  After verbal consent patient was treated with HVLA, ME techniques in cervical, thoracic lumbar and sacral areas  Patient tolerated the procedure well with improvement in symptoms  Patient given exercises, stretches and lifestyle modifications  See medications in patient instructions if given  Patient out to 6 weeks.

## 2014-08-31 NOTE — Progress Notes (Signed)
  Tawana ScaleZach Smith D.O. [facility] [address] [place], [place] [address] Phone: [phone](336) [phone] Subjective:     CC: back pain follow up   BJY:NWGNFAOZHYHPI:Subjective Yolanda RoysChristine Cooper is a 39 y.o. female coming in with complaint of back pain. Patient was diagnosed with piriformis syndrome as well as sacroiliac joint dysfunction. Patient has been responding to OMT.   Patient is doing significantly better. She is a low back as beneficial. Patient did have a few more allergies. Anesthetic often assist, some upper back pain.  No significant problems though.    Lumbar x-rays are completely unremarkable taken April 2015 showed no significant findings.     Past medical history, social, surgical and family history all reviewed in electronic medical record.   Review of Systems: No headache, visual changes, nausea, vomiting, diarrhea, constipation, dizziness, abdominal pain, skin rash, fevers, chills, night sweats, weight loss, swollen lymph nodes, body aches, joint swelling, muscle aches, chest pain, shortness of breath, mood changes.   Objective Blood pressure 106/72, pulse 92, height 5\' 9"  (1.753 m), weight 186 lb (84.369 kg), SpO2 96 %.  General: No apparent distress the patient is anxious alert and oriented x3 mood and affect normal, dressed appropriately.  HEENT: Pupils equal, extraocular movements intact  Respiratory: Patient's speak in full sentences and does not appear short of breath  Cardiovascular: No lower extremity edema, non tender, no erythema  Skin: Warm dry intact with no signs of infection or rash on extremities or on axial skeleton.  Abdomen: Soft nontender  Neuro: Cranial nerves II through XII are intact, neurovascularly intact in all extremities with 2+ DTRs and 2+ pulses.  Lymph: No lymphadenopathy of posterior or anterior cervical chain or axillae bilaterally.  Gait normal with good balance and coordination.  MSK:  Non tender with full range of motion and good stability  and symmetric strength and tone of shoulders, elbows, wrist, hip, knee and ankles bilaterally. Poor course strength Back Exam:  Inspection no rash noted Motion: Flexion 45 deg, Extension 45 deg, Side Bending to 45 deg bilaterally,  Rotation to 45 deg bilaterally  SLR laying: Negative  XSLR laying: Negative  Palpable tenderness: Really nontender on exam today. FABER: Positive right but improved.  Sensory change: Gross sensation intact to all lumbar and sacral dermatomes.  Reflexes: 2+ at both patellar tendons, 2+ at achilles tendons, Babinski's downgoing.  Strength at foot  Plantar-flexion: 5/5 Dorsi-flexion: 5/5 Eversion: 5/5 Inversion: 5/5  Leg strength  Quad: 5/5 Hamstring: 5/5 Hip flexor: 5/5 Hip abductors: 4+/5 with continued minimal  improvement Gait unremarkable.   OMT Physical Exam  Cervical  C4 flexed rotated and side bent right  Thoracic T5 extended rotated and side bent left T3 extended rotated and side bent right   Lumbar L2 flexed rotated inside that right  Sacrum Left on left     Impression and Recommendations:     This case required medical decision making of moderate complexity.

## 2014-08-31 NOTE — Progress Notes (Signed)
Pre visit review using our clinic review tool, if applicable. No additional management support is needed unless otherwise documented below in the visit note. 

## 2014-08-31 NOTE — Patient Instructions (Addendum)
Great to see you as always.  Stay active! Conitnue to do exercises.  Y-T-A exercises hold 2 seconds in each position for 10 reps daily Continue the vitamins flonase daily Best I have seen your lower back in quite awhile.  Lets go 6 weeks

## 2014-10-12 ENCOUNTER — Ambulatory Visit: Payer: BLUE CROSS/BLUE SHIELD | Admitting: Family Medicine

## 2014-10-14 ENCOUNTER — Ambulatory Visit (INDEPENDENT_AMBULATORY_CARE_PROVIDER_SITE_OTHER): Payer: BLUE CROSS/BLUE SHIELD | Admitting: Family Medicine

## 2014-10-14 ENCOUNTER — Encounter: Payer: Self-pay | Admitting: Family Medicine

## 2014-10-14 VITALS — BP 112/80 | HR 80 | Ht 69.0 in | Wt 185.0 lb

## 2014-10-14 DIAGNOSIS — M533 Sacrococcygeal disorders, not elsewhere classified: Secondary | ICD-10-CM | POA: Diagnosis not present

## 2014-10-14 DIAGNOSIS — M9902 Segmental and somatic dysfunction of thoracic region: Secondary | ICD-10-CM | POA: Diagnosis not present

## 2014-10-14 DIAGNOSIS — M9901 Segmental and somatic dysfunction of cervical region: Secondary | ICD-10-CM | POA: Diagnosis not present

## 2014-10-14 DIAGNOSIS — M9903 Segmental and somatic dysfunction of lumbar region: Secondary | ICD-10-CM

## 2014-10-14 DIAGNOSIS — M999 Biomechanical lesion, unspecified: Secondary | ICD-10-CM

## 2014-10-14 NOTE — Progress Notes (Signed)
Pre visit review using our clinic review tool, if applicable. No additional management support is needed unless otherwise documented below in the visit note. 

## 2014-10-14 NOTE — Progress Notes (Signed)
  [name] [address] [place], [place] [address] Phone: [phone] Subjective:     CC: back pain follow up   KZL:[id number] Yolanda Cooper is a 39 y.o. female coming in with complaint of back pain. Patient was diagnosed with piriformis syndrome as well as sacroiliac joint dysfunction. Patient has been responding to OMT.  Patient overall has been doing relatively well. Patient did have to reschedule this appointment previously. This is been a little longer than usual. Patient states oral she has been doing very well. The last couple days she started having increasing pain but very minimal. Patient states that she's been able to do all activities of daily living. Has been very active this summer. Patient is trying to work out fairly regularly and tries to do the exercises at least intermittently. Denies any numbness or tingling. Denies any new symptoms.    Lumbar x-rays are completely unremarkable taken April 2015 showed no significant findings.     Past medical history, social, surgical and family history all reviewed in electronic medical record.   Review of Systems: No headache, visual changes, nausea, vomiting, diarrhea, constipation, dizziness, abdominal pain, skin rash, fevers, chills, night sweats, weight loss, swollen lymph nodes, body aches, joint swelling, muscle aches, chest pain, shortness of breath, mood changes.   Objective Blood pressure 112/80, pulse 80, height 5\' 9"  (1.753 m), weight 185 lb (83.915 kg), SpO2 97 %.  General: No apparent distress the patient is anxious alert and oriented x3 mood and affect normal, dressed appropriately.  HEENT: Pupils equal, extraocular movements intact  Respiratory: Patient's speak in full sentences and does not appear short of breath  Cardiovascular: No lower extremity edema, non tender, no erythema  Skin: Warm dry intact with no signs of infection or rash on extremities or on axial skeleton.    Abdomen: Soft nontender  Neuro: Cranial nerves II through XII are intact, neurovascularly intact in all extremities with 2+ DTRs and 2+ pulses.  Lymph: No lymphadenopathy of posterior or anterior cervical chain or axillae bilaterally.  Gait normal with good balance and coordination.  MSK:  Non tender with full range of motion and good stability and symmetric strength and tone of shoulders, elbows, wrist, hip, knee and ankles bilaterally. Poor course strength Back Exam:  Inspection no rash noted Motion: Flexion 45 deg, Extension 45 deg, Side Bending to 45 deg bilaterally,  Rotation to 45 deg bilaterally  SLR laying: Negative  XSLR laying: Negative  Palpable tenderness: minimal tenderness paraspinal musculature of the lumbar spine FABER: Positive right still  Sensory change: Gross sensation intact to all lumbar and sacral dermatomes.  Reflexes: 2+ at both patellar tendons, 2+ at achilles tendons, Babinski's downgoing.  Strength at foot  Plantar-flexion: 5/5 Dorsi-flexion: 5/5 Eversion: 5/5 Inversion: 5/5  Leg strength  Quad: 5/5 Hamstring: 5/5 Hip flexor: 5/5 Hip abductors: 4+/5  Gait unremarkable.   OMT Physical Exam  Cervical  C4 flexed rotated and side bent right  Thoracic T5 extended rotated and side bent left T8 extended rotated and side bent right   Lumbar L2 flexed rotated inside that right  Sacrum Left on left     Impression and Recommendations:     This case required medical decision making of moderate complexity.

## 2014-10-14 NOTE — Assessment & Plan Note (Signed)
Patient is doing very well overall. We discussed icing regimen and home exercises. We discussed what activities to potentially avoid. Patients can be very active this summer and we discussed with her that we would like her to do more the strengthening. Patient continue with the vitamin supplementation in the icing protocol. Patient come back though in 6 weeks for further evaluation and treatment.

## 2014-10-14 NOTE — Assessment & Plan Note (Signed)
Decision today to treat with OMT was based on Physical Exam  After verbal consent patient was treated with HVLA, ME techniques in cervical, thoracic lumbar and sacral areas  Patient tolerated the procedure well with improvement in symptoms  Patient given exercises, stretches and lifestyle modifications  See medications in patient instructions if given  Patient out to 6 weeks.

## 2014-10-14 NOTE — Patient Instructions (Signed)
Always good to see you You are doing great overall.  I dont want to change a thing Try to do the exercises more regularly but I  Know it is summer Have a great one and see you in 5-6 weeks.

## 2014-11-22 ENCOUNTER — Encounter: Payer: Self-pay | Admitting: Family Medicine

## 2014-11-22 ENCOUNTER — Ambulatory Visit (INDEPENDENT_AMBULATORY_CARE_PROVIDER_SITE_OTHER): Payer: BLUE CROSS/BLUE SHIELD | Admitting: Family Medicine

## 2014-11-22 VITALS — BP 104/82 | HR 84 | Ht 69.0 in | Wt 187.0 lb

## 2014-11-22 DIAGNOSIS — M9903 Segmental and somatic dysfunction of lumbar region: Secondary | ICD-10-CM | POA: Diagnosis not present

## 2014-11-22 DIAGNOSIS — M533 Sacrococcygeal disorders, not elsewhere classified: Secondary | ICD-10-CM | POA: Diagnosis not present

## 2014-11-22 DIAGNOSIS — M9901 Segmental and somatic dysfunction of cervical region: Secondary | ICD-10-CM

## 2014-11-22 DIAGNOSIS — M999 Biomechanical lesion, unspecified: Secondary | ICD-10-CM

## 2014-11-22 DIAGNOSIS — M9902 Segmental and somatic dysfunction of thoracic region: Secondary | ICD-10-CM

## 2014-11-22 NOTE — Progress Notes (Signed)
  [name] [address] [place], [place] [address] Phone: [phone] Subjective:     CC: back pain follow up   BJY:NWGNFAOZHY Erikah Thumm is a 39 y.o. female coming in with complaint of back pain. Patient was diagnosed with piriformis syndrome as well as sacroiliac joint dysfunction. Patient has been responding to OMT.  Patient continues to have some tightness we'll see on the right side. Patient has had tight hip flexors previously as well. Not doing the exercises as regular she should. Patient states that she feels very busy and has not had time to take care of herself.    Lumbar x-rays are completely unremarkable taken April 2015 showed no significant findings.     Past medical history, social, surgical and family history all reviewed in electronic medical record.   Review of Systems: No headache, visual changes, nausea, vomiting, diarrhea, constipation, dizziness, abdominal pain, skin rash, fevers, chills, night sweats, weight loss, swollen lymph nodes, body aches, joint swelling, muscle aches, chest pain, shortness of breath, mood changes.   Objective Blood pressure 104/82, pulse 84, height  (1.753 m), weight 187 lb (84.823 kg), SpO2 98 %.  General: No apparent distress the patient is anxious alert and oriented x3 mood and affect normal, dressed appropriately.  HEENT: Pupils equal, extraocular movements intact  Respiratory: Patient's speak in full sentences and does not appear short of breath  Cardiovascular: No lower extremity edema, non tender, no erythema  Skin: Warm dry intact with no signs of infection or rash on extremities or on axial skeleton.  Abdomen: Soft nontender  Neuro: Cranial nerves II through XII are intact, neurovascularly intact in all extremities with 2+ DTRs and 2+ pulses.  Lymph: No lymphadenopathy of posterior or anterior cervical chain or axillae bilaterally.  Gait normal with good balance and coordination.    MSK:  Non tender with full range of motion and good stability and symmetric strength and tone of shoulders, elbows, wrist, hip, knee and ankles bilaterally. Poor course strength Back Exam:  Inspection no rash noted Motion: Flexion 45 deg, Extension 25 deg, Side Bending to 45 deg bilaterally,  Rotation to 45 deg bilaterally  SLR laying: Negative  XSLR laying: Negative  Palpable tenderness: Continued tenderness of the paraspinal musculature of the lumbar spine bilaterally FABER: Positive right still  Sensory change: Gross sensation intact to all lumbar and sacral dermatomes.  Reflexes: 2+ at both patellar tendons, 2+ at achilles tendons, Babinski's downgoing.  Strength at foot  Plantar-flexion: 5/5 Dorsi-flexion: 5/5 Eversion: 5/5 Inversion: 5/5  Leg strength  Quad: 5/5 Hamstring: 5/5 Hip flexor: 5/5 Hip abductors: 4+/5  Gait unremarkable.   OMT Physical Exam  Cervical  C4 flexed rotated and side bent right C7 flexed rotated and side bent left  Thoracic T5 extended rotated and side bent left T7 extended rotated and side bent right   Lumbar L2 flexed rotated and side bent  right L4 flexed rotated and side bent left  Sacrum Left on left     Impression and Recommendations:     This case required medical decision making of moderate complexity.

## 2014-11-22 NOTE — Progress Notes (Signed)
Pre visit review using our clinic review tool, if applicable. No additional management support is needed unless otherwise documented below in the visit note. 

## 2014-11-22 NOTE — Assessment & Plan Note (Signed)
Discussed with patient the importance of range of motion as well as stretching the hip flexors. Patient was showed proper technique of home exercises again. We discussed icing regimen and continuing the natural supplementations. Patient and will come back and see me again in 4-5 weeks for further evaluation and treatment.

## 2014-11-22 NOTE — Assessment & Plan Note (Signed)
Decision today to treat with OMT was based on Physical Exam  After verbal consent patient was treated with HVLA, ME techniques in cervical, thoracic lumbar and sacral areas  Patient tolerated the procedure well with improvement in symptoms  Patient given exercises, stretches and lifestyle modifications  See medications in patient instructions if given  Patient out to 4-5 weeks. Patient cannot go for a total of 6 weeks and still has some tightness.

## 2014-11-22 NOTE — Patient Instructions (Addendum)
Good to see you I continue to be impressed.  Continue to stay active and have fun this summer.  Try to do the one knee down and one up with rotation to the upper leg and hold for 10 seconds repeat on opposite side.  Cannot do it too much.  Conitnue everything else.  See me again in 5 weeks.

## 2014-12-18 IMAGING — CR DG LUMBAR SPINE COMPLETE 4+V
5 series · 5 of 5 positions shown · non-contrast
Comparison: None.

CLINICAL DATA: Low back pain

EXAM:
LUMBAR SPINE - COMPLETE 4+ VIEW

[view not recorded (1 of 5)]
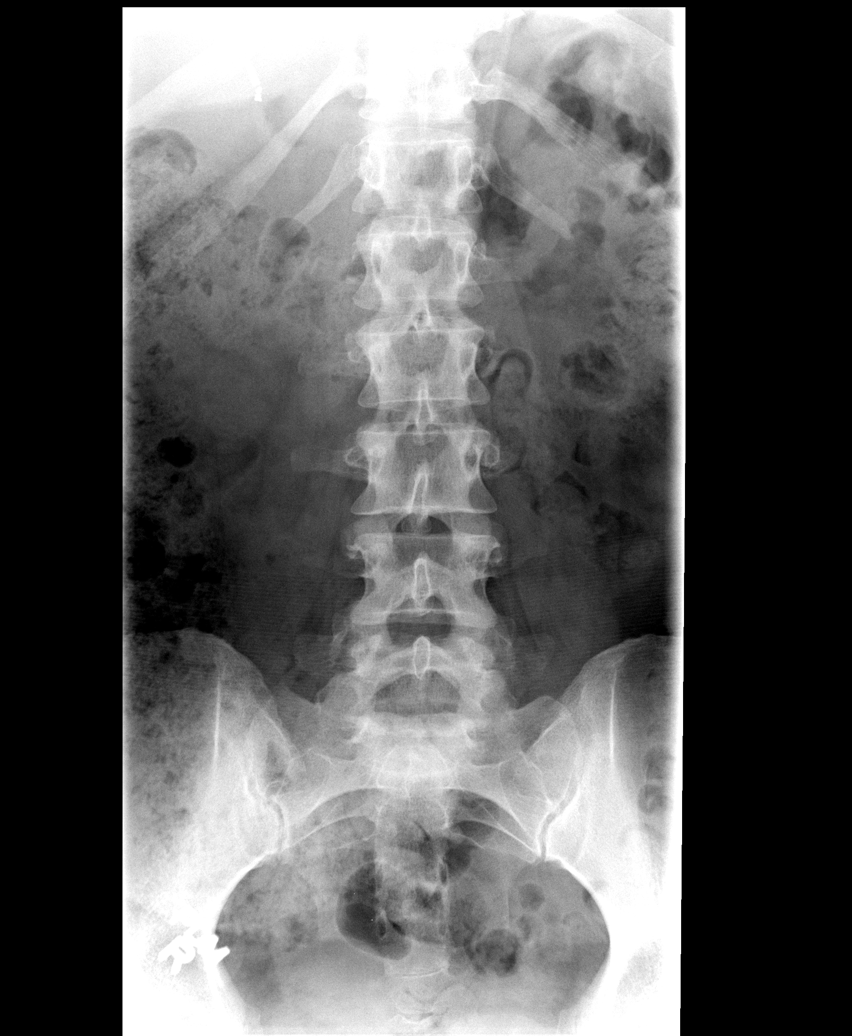

[view not recorded (2 of 5)]
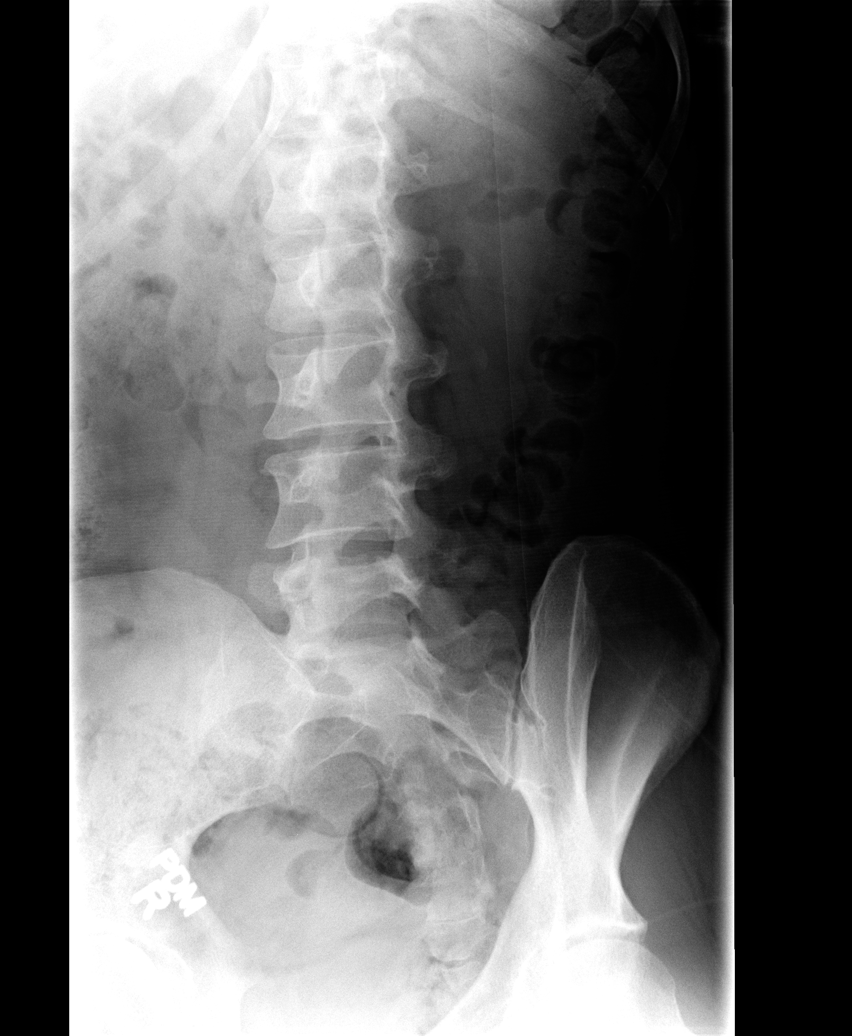

[view not recorded (3 of 5)]
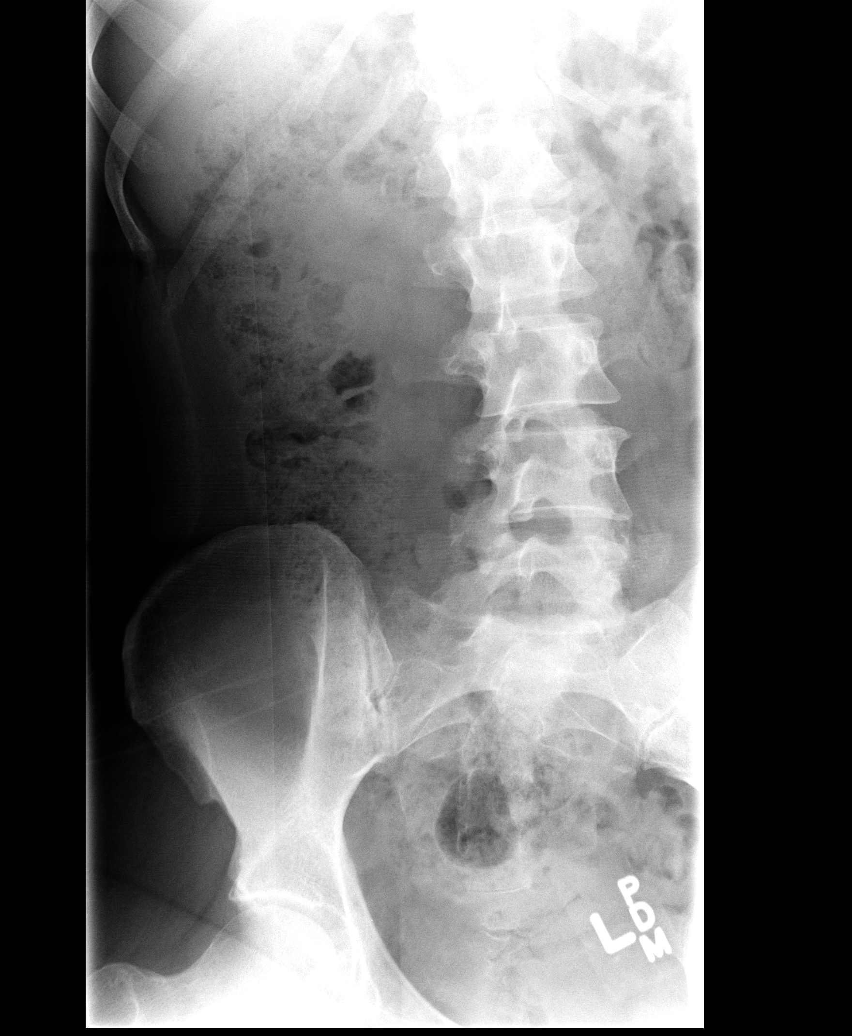

[view not recorded (4 of 5)]
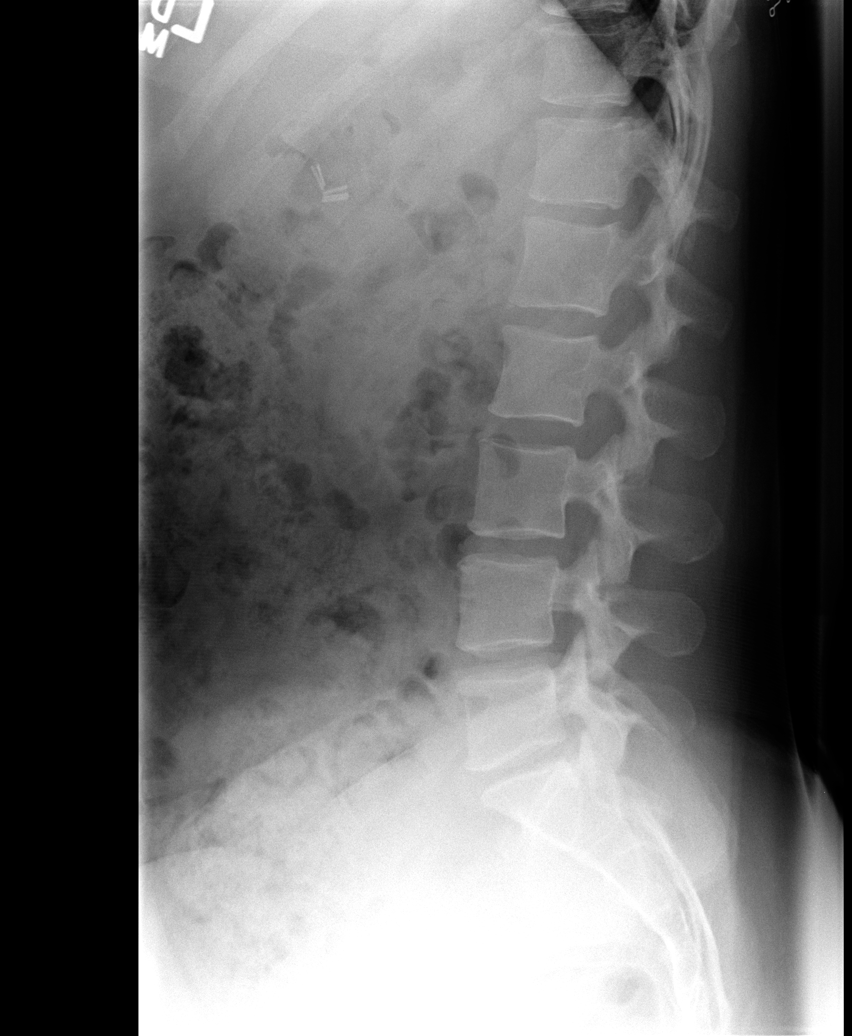

[view not recorded (5 of 5)]
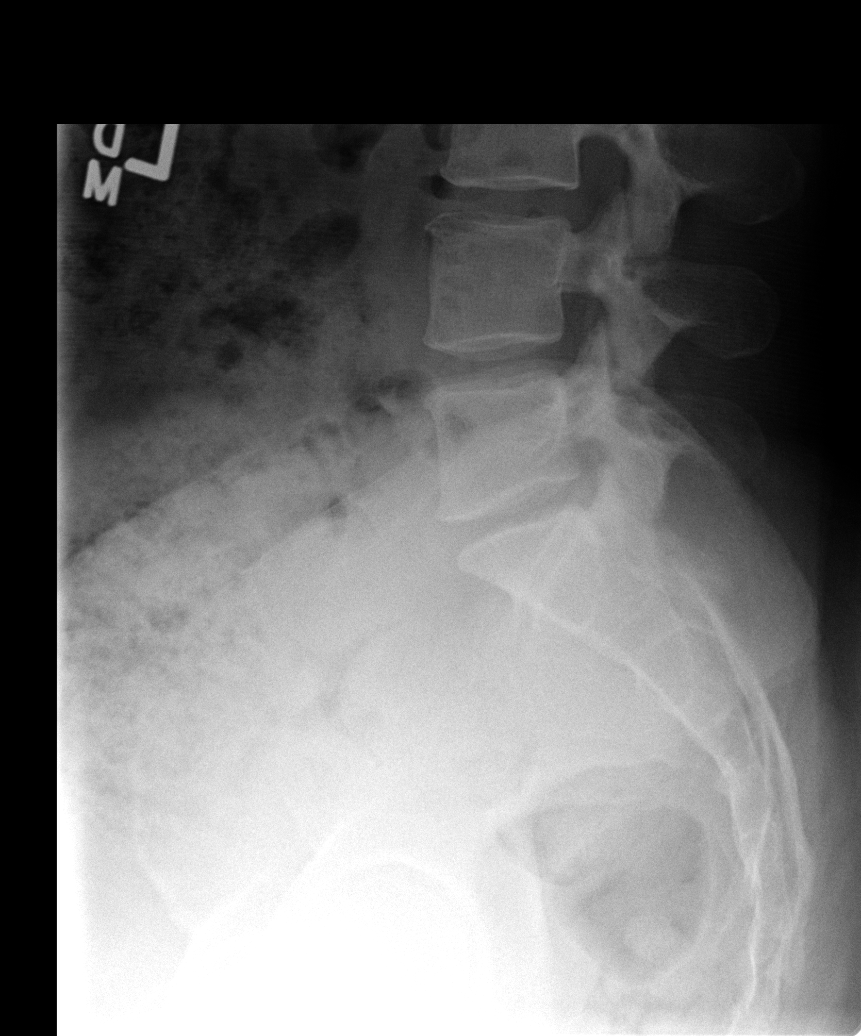

[5 of 5 positions shown; findings below may reference images not displayed]

FINDINGS: Frontal, lateral, spot lumbosacral lateral, and bilateral oblique
views were obtained. There are 5 non-rib-bearing lumbar type
vertebral bodies. There is no fracture or spondylolisthesis. Disc
spaces appear intact. There is no appreciable facet arthropathy.
IMPRESSION: No fracture or appreciable arthropathy.

## 2014-12-28 ENCOUNTER — Ambulatory Visit (INDEPENDENT_AMBULATORY_CARE_PROVIDER_SITE_OTHER): Payer: BLUE CROSS/BLUE SHIELD | Admitting: Family Medicine

## 2014-12-28 ENCOUNTER — Encounter: Payer: Self-pay | Admitting: Family Medicine

## 2014-12-28 VITALS — BP 106/78 | HR 66 | Ht 69.0 in | Wt 185.0 lb

## 2014-12-28 DIAGNOSIS — M999 Biomechanical lesion, unspecified: Secondary | ICD-10-CM

## 2014-12-28 DIAGNOSIS — M533 Sacrococcygeal disorders, not elsewhere classified: Secondary | ICD-10-CM | POA: Diagnosis not present

## 2014-12-28 DIAGNOSIS — M9901 Segmental and somatic dysfunction of cervical region: Secondary | ICD-10-CM

## 2014-12-28 DIAGNOSIS — M9902 Segmental and somatic dysfunction of thoracic region: Secondary | ICD-10-CM | POA: Diagnosis not present

## 2014-12-28 DIAGNOSIS — M9903 Segmental and somatic dysfunction of lumbar region: Secondary | ICD-10-CM | POA: Diagnosis not present

## 2014-12-28 NOTE — Progress Notes (Signed)
Pre visit review using our clinic review tool, if applicable. No additional management support is needed unless otherwise documented below in the visit note. 

## 2014-12-28 NOTE — Assessment & Plan Note (Signed)
Decision today to treat with OMT was based on Physical Exam  After verbal consent patient was treated with HVLA, ME techniques in cervical, thoracic lumbar and sacral areas  Patient tolerated the procedure well with improvement in symptoms  Patient given exercises, stretches and lifestyle modifications  See medications in patient instructions if given  Will return within 6 weeks.

## 2014-12-28 NOTE — Assessment & Plan Note (Signed)
Patient's encouraged to continue the exercises and core strengthening. We'll continue to monitor. Patient will do more groin stretching as well. Given some new exercises of topical beneficial. Discussed icing regimen. Patient come back and see me again in 5-6 weeks.

## 2014-12-28 NOTE — Patient Instructions (Addendum)
Good to see you Ice will help if you need it.  After a lot of flexed activity please do the stretches Continue to work on the core.  Maybe incorporate new exercises a couple times a week.  See me again in 6 weeks.

## 2014-12-28 NOTE — Progress Notes (Signed)
  [name] [address] [place], [place] [address] Phone: [phone] Subjective:     CC: back pain follow up   [id number] Yolanda Cooper is a 39 y.o. female coming in with complaint of back pain. Patient was diagnosed with piriformis syndrome as well as sacroiliac joint dysfunction. Patient has been responding to OMT.   Patient has time for her so she usually does fairly well and does the exercises on a regular basis. Patient continues with the natural vitamins. Trying to avoid certain activities. Patient is trying to become on a more scheduled regimen with now her kids being in school. Patient states she is doing better.  Still some tightness. Patient is a muscle tightness seems to be more in her groin area which is a little different. A little stress in her life which makes back a little more tight as well.    Lumbar x-rays are completely unremarkable taken April 2015 showed no significant findings.     Past medical history, social, surgical and family history all reviewed in electronic medical record.   Review of Systems: No headache, visual changes, nausea, vomiting, diarrhea, constipation, dizziness, abdominal pain, skin rash, fevers, chills, night sweats, weight loss, swollen lymph nodes, body aches, joint swelling, muscle aches, chest pain, shortness of breath, mood changes.   Objective Blood pressure 106/78, pulse 66, height  (1.753 m), weight 185 lb (83.915 kg), SpO2 95 %.  General: No apparent distress the patient is anxious alert and oriented x3 mood and affect normal, dressed appropriately.  HEENT: Pupils equal, extraocular movements intact  Respiratory: Patient's speak in full sentences and does not appear short of breath  Cardiovascular: No lower extremity edema, non tender, no erythema  Skin: Warm dry intact with no signs of infection or rash on extremities or on axial skeleton.  Abdomen: Soft nontender  Neuro: Cranial  nerves II through XII are intact, neurovascularly intact in all extremities with 2+ DTRs and 2+ pulses.  Lymph: No lymphadenopathy of posterior or anterior cervical chain or axillae bilaterally.  Gait normal with good balance and coordination.  MSK:  Non tender with full range of motion and good stability and symmetric strength and tone of shoulders, elbows, wrist, hip, knee and ankles bilaterally. Poor course strength Back Exam:  Inspection no rash noted Motion: Flexion 45 deg, Extension 25 deg, Side Bending to 45 deg bilaterally,  Rotation to 45 deg bilaterally  SLR laying: Negative  XSLR laying: Negative  Palpable tenderness: Tenderness in the groin area FABER: Positive right still  Sensory change: Gross sensation intact to all lumbar and sacral dermatomes.  Reflexes: 2+ at both patellar tendons, 2+ at achilles tendons, Babinski's downgoing.  Strength at foot  Plantar-flexion: 5/5 Dorsi-flexion: 5/5 Eversion: 5/5 Inversion: 5/5  Leg strength  Quad: 5/5 Hamstring: 5/5 Hip flexor: 4/5 mild weakness from previous exam  Hip abductors: 4+/5  Gait unremarkable.   OMT Physical Exam  Cervical  C4 flexed rotated and side bent right  Thoracic T5 extended rotated and side bent left T7 extended rotated and side bent right   Lumbar L2 flexed rotated and side bent  right  Sacrum Left on left     Impression and Recommendations:     This case required medical decision making of moderate complexity.

## 2015-02-01 ENCOUNTER — Encounter: Payer: Self-pay | Admitting: Family Medicine

## 2015-02-01 ENCOUNTER — Ambulatory Visit (INDEPENDENT_AMBULATORY_CARE_PROVIDER_SITE_OTHER): Payer: BLUE CROSS/BLUE SHIELD | Admitting: Family Medicine

## 2015-02-01 VITALS — BP 130/82 | HR 73 | Ht 69.0 in | Wt 187.0 lb

## 2015-02-01 DIAGNOSIS — M9903 Segmental and somatic dysfunction of lumbar region: Secondary | ICD-10-CM | POA: Diagnosis not present

## 2015-02-01 DIAGNOSIS — M533 Sacrococcygeal disorders, not elsewhere classified: Secondary | ICD-10-CM

## 2015-02-01 DIAGNOSIS — M9901 Segmental and somatic dysfunction of cervical region: Secondary | ICD-10-CM | POA: Diagnosis not present

## 2015-02-01 DIAGNOSIS — M999 Biomechanical lesion, unspecified: Secondary | ICD-10-CM

## 2015-02-01 DIAGNOSIS — M9902 Segmental and somatic dysfunction of thoracic region: Secondary | ICD-10-CM

## 2015-02-01 NOTE — Progress Notes (Signed)
  Tawana ScaleZach Smith D.O. [facility] [address] [place], [place] [address] Phone: [phone] Subjective:     CC: back pain follow up   WGN:FAOZHYQMVHHPI:Subjective Yolanda RoysChristine Cooper is a 39 y.o. female coming in with complaint of back pain. Patient was diagnosed with piriformis syndrome as well as sacroiliac joint dysfunction. Patient has been responding to OMT.   Patient is been doing very well. Patient states some mild discomfort from time to time on the anterior hip but states that the back seems to be doing better. Patient has not been doing exercises as much as she should be doing and she knows that. Denies any new symptoms. Overall think she continues to improve slowly.    Lumbar x-rays are completely unremarkable taken April 2015 showed no significant findings.     Past medical history, social, surgical and family history all reviewed in electronic medical record.   Review of Systems: No headache, visual changes, nausea, vomiting, diarrhea, constipation, dizziness, abdominal pain, skin rash, fevers, chills, night sweats, weight loss, swollen lymph nodes, body aches, joint swelling, muscle aches, chest pain, shortness of breath, mood changes.   Objective Blood pressure 130/82, pulse 73, height 5\' 9"  (1.753 m), weight 187 lb (84.823 kg), SpO2 97 %.  General: No apparent distress the patient is anxious alert and oriented x3 mood and affect normal, dressed appropriately.  HEENT: Pupils equal, extraocular movements intact  Respiratory: Patient's speak in full sentences and does not appear short of breath  Cardiovascular: No lower extremity edema, non tender, no erythema  Skin: Warm dry intact with no signs of infection or rash on extremities or on axial skeleton.  Abdomen: Soft nontender  Neuro: Cranial nerves II through XII are intact, neurovascularly intact in all extremities with 2+ DTRs and 2+ pulses.  Lymph: No lymphadenopathy of posterior or anterior cervical chain or axillae  bilaterally.  Gait normal with good balance and coordination.  MSK:  Non tender with full range of motion and good stability and symmetric strength and tone of shoulders, elbows, wrist, hip, knee and ankles bilaterally. Poor course strength Back Exam:  Inspection no rash noted Motion: Flexion 45 deg, Extension 25 deg, Side Bending to 45 deg bilaterally,  Rotation to 45 deg bilaterally  SLR laying: Negative  XSLR laying: Negative  Palpable tenderness: Tenderness in the groin area FABER: Positive right still  Sensory change: Gross sensation intact to all lumbar and sacral dermatomes.  Reflexes: 2+ at both patellar tendons, 2+ at achilles tendons, Babinski's downgoing.  Strength at foot  Plantar-flexion: 5/5 Dorsi-flexion: 5/5 Eversion: 5/5 Inversion: 5/5  Leg strength  Quad: 5/5 Hamstring: 5/5 Hip flexor: 4+ out of 5 which is an improvement from previous exam on right compared to the contralateral side.  Hip abductors: 4+/5  Gait unremarkable.   OMT Physical Exam  Cervical  C4 flexed rotated and side bent right  Thoracic T5 extended rotated and side bent left T7 extended rotated and side bent right   Lumbar L2 flexed rotated and side bent  right  Sacrum Left on left Ileum right posterior    Impression and Recommendations:     This case required medical decision making of moderate complexity.

## 2015-02-01 NOTE — Assessment & Plan Note (Signed)
Decision today to treat with OMT was based on Physical Exam  After verbal consent patient was treated with HVLA, ME techniques in cervical, thoracic lumbar and sacral areas  Patient tolerated the procedure well with improvement in symptoms  Patient given exercises, stretches and lifestyle modifications  See medications in patient instructions if given  Will return within 6-7 weeks.

## 2015-02-01 NOTE — Progress Notes (Signed)
Pre visit review using our clinic review tool, if applicable. No additional management support is needed unless otherwise documented below in the visit note. 

## 2015-02-01 NOTE — Patient Instructions (Signed)
Nothing new Try to keep the exercises when you can ideally 3 times a week.  The core is doing better but still a little better Continue the vitamins See me again 6-7 weeks.

## 2015-02-01 NOTE — Assessment & Plan Note (Signed)
Patient has been doing remarkably well. Encourage her to continue to work on core strength in trying to do the exercises on a more regular basis. We discussed icing regimen and which activities to potentially avoid. Patient is going to try to take more time for herself in the near future. Patient will come back and see me again in 6-7 weeks.

## 2016-01-31 ENCOUNTER — Encounter: Payer: Self-pay | Admitting: *Deleted

## 2016-01-31 ENCOUNTER — Emergency Department (INDEPENDENT_AMBULATORY_CARE_PROVIDER_SITE_OTHER)
Admission: EM | Admit: 2016-01-31 | Discharge: 2016-01-31 | Disposition: A | Discharge status: Home / Self Care | Payer: BLUE CROSS/BLUE SHIELD | Source: Home / Self Care | Attending: Family Medicine | Admitting: Family Medicine

## 2016-01-31 DIAGNOSIS — J0100 Acute maxillary sinusitis, unspecified: Secondary | ICD-10-CM | POA: Diagnosis not present

## 2016-01-31 DIAGNOSIS — H6122 Impacted cerumen, left ear: Secondary | ICD-10-CM

## 2016-01-31 MED ORDER — POLYMYXIN B-TRIMETHOPRIM 10000-0.1 UNIT/ML-% OP SOLN: 1.0000 [drp] | OPHTHALMIC | 0 refills | Status: DC

## 2016-01-31 MED ORDER — AMOXICILLIN-POT CLAVULANATE 875-125 MG PO TABS: 1.0000 | ORAL_TABLET | Freq: Two times a day (BID) | ORAL | 0 refills | Status: DC

## 2016-01-31 MED ORDER — FLUTICASONE PROPIONATE 50 MCG/ACT NA SUSP: 2.0000 | Freq: Every day | NASAL | 2 refills | Status: DC

## 2016-01-31 NOTE — ED Triage Notes (Signed)
Pt c/o yellow nasal congestion, HA and facial pain x 1 wk. Denies fever. She took Nyquil last night.

## 2016-01-31 NOTE — ED Provider Notes (Signed)
CSN: [id number]     Arrival date & time 01/31/16  1428 History   First MD Initiated Contact with Patient 01/31/16 1453     Chief Complaint  [id number] presents with  . Nasal Congestion   (Consider location/radiation/quality/duration/timing/severity/associated sxs/prior Treatment) HPI Yolanda Cooper is a 40 y.o. female presenting to UC with c/o gradually worsening nasal congestion with facial pain and pressure for 1 week.  Pain is minimal at this time.  Associated yellow nasal discharge and bilateral ear fullness.  She also reports waking this morning with crusty discharge from Right eye that has now resolved.  She did take Nyquil last night with minimal relief. She [place] and is worried her symptoms will continue to worsen. Denies fever, chills, n/v/d.   Past Medical History:  Diagnosis Date  . Anxiety   . Fatty liver   . Nonspecific elevation of levels of transaminase and lactic acid dehydrogenase (LDH)   . Other chronic pain   . Overweight   . Rosacea   . Unspecified vitamin D deficiency    Past Surgical History:  Procedure Laterality Date  . CESAREAN SECTION    . CHOLECYSTECTOMY    . WISDOM TOOTH EXTRACTION     Family History  Problem Relation Age of Onset  . Diabetes Father   . Hyperlipidemia Father   . Hypertension Father   . Cancer Other     colon cancer   . Anuerysm Other   . Diabetes Paternal Grandmother    Social History  Substance Use Topics  . Smoking status: Never Smoker  . Smokeless tobacco: Never Used  . Alcohol use Yes     Comment: Rarely    OB History    No data available     Review of Systems  Constitutional: Negative for chills and fever.  HENT: Positive for congestion, ear pain (fullness), rhinorrhea and sinus pressure. Negative for sore throat, trouble swallowing and voice change.   Respiratory: Positive for cough ( minimal). Negative for shortness of breath.   Cardiovascular: Negative for chest  pain and palpitations.  Gastrointestinal: Negative for abdominal pain, diarrhea, nausea and vomiting.  Musculoskeletal: Negative for arthralgias, back pain and myalgias.  Skin: Negative for rash.  Neurological: Positive for headaches ( frontal). Negative for dizziness and light-headedness.    Allergies  Review of patient's allergies indicates no known allergies.  Home Medications   Prior to Admission medications   Medication Sig Start Date End Date Taking? Authorizing Provider  amoxicillin-clavulanate (AUGMENTIN) 875-125 MG tablet Take 1 tablet by mouth 2 (two) times daily. One po bid x 7 days 01/31/16   Junius FinnerErin O'Malley, PA-C  fluticasone [facility](FLONASE) 50 MCG/ACT nasal spray Place 2 sprays into both nostrils daily. 01/31/16   Junius FinnerErin O'Malley, PA-C  PROBIOTIC PRODUCT PO Take by mouth.    Historical Provider, MD  trimethoprim-polymyxin b (POLYTRIM) ophthalmic solution Place 1 drop into the right eye every 4 (four) hours. For 5 days 01/31/16   Junius FinnerErin O'Malley, PA-C  TURMERIC PO Take by mouth.    Historical Provider, MD   Meds Ordered and Administered this Visit  Medications - No data to display  BP 105/73 (BP Location: Left Arm)   Pulse 95   Temp 98.4 F (36.9 C) (Oral)   Resp 16   Ht 5\' 9"  (1.753 m)   Wt 173 lb (78.5 kg)   LMP 01/31/2016   SpO2 99%   BMI 25.55 kg/m  No data found.   Physical Exam  Constitutional: She appears well-developed and well-nourished. No distress.  HENT:  Head: Normocephalic and atraumatic.  Right Ear: Tympanic membrane normal.  Left Ear: Tympanic membrane is not erythematous and not bulging. A middle ear effusion is present.  Nose: Mucosal edema present. Right sinus exhibits maxillary sinus tenderness. Right sinus exhibits no frontal sinus tenderness. Left sinus exhibits maxillary sinus tenderness and frontal sinus tenderness.  Mouth/Throat: Uvula is midline, oropharynx is clear and moist and mucous membranes are normal.  Cerumen impaction Left ear, TM  visualized after cerumen removal. Small middle ear effusion.   Eyes: Conjunctivae and EOM are normal. Pupils are equal, round, and reactive to light. Right eye exhibits no discharge. Left eye exhibits no discharge. No scleral icterus.  Neck: Normal range of motion. Neck supple.  Cardiovascular: Normal rate, regular rhythm and normal heart sounds.   Pulmonary/Chest: Effort normal and breath sounds normal. No respiratory distress. She has no wheezes. She has no rales.  Abdominal: Soft. She exhibits no distension. There is no tenderness.  Musculoskeletal: Normal range of motion.  Neurological: She is alert.  Skin: Skin is warm and dry. She is not diaphoretic.  Nursing note and vitals reviewed.   Urgent Care Course   Clinical Course    Procedures (including critical care time)  Labs Review Labs Reviewed - No data to display  Imaging Review No results found.   MDM   1. Acute non-recurrent maxillary sinusitis   2. Hearing loss of left ear due to cerumen impaction    Pt c/o worsening sinus congestion, pain and pressure for about 1 week. Sinus tenderness on exam. Pt also concerned for potential pink eye in Right eye due to reported crusting discharge this morning. No evidence of bacterial conjunctivitis noted on exam at this time.  Will treat for bacterial sinusitis. Rx: Augmentin and Flonase. Prescription to hold for polytrim ophthalmic drops- only to use if eye discharge worsens while she is out of town. F/u with PCP in 1 week if not improving, sooner if worsening.     Junius Finner, PA-C 01/31/16 1547

## 2016-09-04 NOTE — Progress Notes (Signed)
[name] [address] [place], [place] [address] Phone: [phone] Subjective:     CC: back pain follow up   BJY:Yolanda Cooper  Yolanda Cooper is a 41 y.o. female coming in with complaint of back pain. Patient was diagnosed with piriformis syndrome as well as sacroiliac joint dysfunction. Patient has been responding to OMT.   Have not seen patient for quite some time. Patient did have another child. Now having some mild increase in low back pain. Continues to be on the right side. Denies any radiation. Rates the severity pain is 5 out of 10. Very similar to previous presentation but worsening.   Lumbar x-rays are completely unremarkable taken April 2015 showed no significant findings.     Past Medical History:  Diagnosis Date  . Anxiety   . Fatty liver   . Nonspecific elevation of levels of transaminase and lactic acid dehydrogenase (LDH)   . Other chronic pain   . Overweight   . Rosacea   . Unspecified vitamin D deficiency    Past Surgical History:  Procedure Laterality Date  . CESAREAN SECTION    . CHOLECYSTECTOMY    . WISDOM TOOTH EXTRACTION     Social History  Substance Use Topics  . Smoking status: Never Smoker  . Smokeless tobacco: Never Used  . Alcohol use Yes     Comment: Rarely    No Known Allergies Family History  Problem Relation Age of Onset  . Diabetes Father   . Hyperlipidemia Father   . Hypertension Father   . Cancer Other        colon cancer   . Anuerysm Other   . Diabetes Paternal Grandmother    Review of Systems: No headache, visual changes, nausea, vomiting, diarrhea, constipation, dizziness, abdominal pain, skin rash, fevers, chills, night sweats, weight loss, swollen lymph nodes, body aches, joint swelling, muscle aches, chest pain, shortness of breath, mood changes.    Objective  Blood pressure 116/80, pulse (!) 101, height 5\' 9"  (1.753 m), SpO2 97 %.  Systems examined below as of 09/05/16 General:  NAD A&O x3 mood, affect normal  HEENT: Pupils equal, extraocular movements intact no nystagmus Respiratory: not short of breath at rest or with speaking Cardiovascular: No lower extremity edema, non tender Skin: Warm dry intact with no signs of infection or rash on extremities or on axial skeleton. Abdomen: Soft nontender, no masses Neuro: Cranial nerves  intact, neurovascularly intact in all extremities with 2+ DTRs and 2+ pulses. Lymph: No lymphadenopathy appreciated today  Gait normal with good balance and coordination.  MSK: Non tender with full range of motion and good stability and symmetric strength and tone of shoulders, elbows, wrist,  knee hips and ankles bilaterally.   Back Exam:  Inspection Loss of lordosis Motion: Flexion 45 deg, Extension 25 deg, Side Bending to 45 deg bilaterally,  Rotation to 45 deg bilaterally  SLR laying: Negative  XSLR laying: Negative  Palpable tenderness: Increased tenderness to palpation in the right sacroiliac joint FABER: Positive right Sensory change: Gross sensation intact to all lumbar and sacral dermatomes.  Reflexes: 2+ at both patellar tendons, 2+ at achilles tendons, Babinski's downgoing.  Strength at foot  5 out of 5 but symmetric   Osteopathic findings Cervical C2 flexed rotated and side bent right C4 flexed rotated and side bent left C6 flexed rotated and side bent left T3 extended rotated and side bent right inhaled third rib T9 extended rotated and side bent left L2  flexed rotated and side bent right Sacrum right on right    Impression and Recommendations:     This case required medical decision making of moderate complexity.

## 2016-09-05 ENCOUNTER — Ambulatory Visit (INDEPENDENT_AMBULATORY_CARE_PROVIDER_SITE_OTHER): Payer: BLUE CROSS/BLUE SHIELD | Admitting: Family Medicine

## 2016-09-05 ENCOUNTER — Encounter: Payer: Self-pay | Admitting: Family Medicine

## 2016-09-05 VITALS — BP 116/80 | HR 101 | Ht 69.0 in

## 2016-09-05 DIAGNOSIS — M533 Sacrococcygeal disorders, not elsewhere classified: Secondary | ICD-10-CM

## 2016-09-05 DIAGNOSIS — M999 Biomechanical lesion, unspecified: Secondary | ICD-10-CM | POA: Diagnosis not present

## 2016-09-05 NOTE — Patient Instructions (Signed)
Good to see you  Yolanda Cooper is your friend.  Exercises 3 times a week.  Planks are good.  You will do great  Vitamin D 2000 Iu daily  See me again in 4 weeks.

## 2016-09-05 NOTE — Assessment & Plan Note (Signed)
Encourage patient to start the exercises. Patient given another handout. We discussed icing regimen and home exercises. We discussed objective recently do a which was to avoid. Responded well to osteopathic manipulation. Patient knows if any radicular symptoms to come back sooner. Otherwise follow-up in 4 weeks.

## 2016-09-05 NOTE — Assessment & Plan Note (Signed)
Decision today to treat with OMT was based on Physical Exam  After verbal consent patient was treated with HVLA, ME, FPR techniques in cervical, thoracic, lumbar and sacral areas  Patient tolerated the procedure well with improvement in symptoms  Patient given exercises, stretches and lifestyle modifications  See medications in patient instructions if given  Patient will follow up in 4 weeks 

## 2016-10-04 ENCOUNTER — Encounter: Payer: Self-pay | Admitting: Family Medicine

## 2016-10-04 ENCOUNTER — Ambulatory Visit (INDEPENDENT_AMBULATORY_CARE_PROVIDER_SITE_OTHER): Payer: BLUE CROSS/BLUE SHIELD | Admitting: Family Medicine

## 2016-10-04 VITALS — BP 112/82 | HR 79 | Ht 69.0 in | Wt 179.0 lb

## 2016-10-04 DIAGNOSIS — M533 Sacrococcygeal disorders, not elsewhere classified: Secondary | ICD-10-CM

## 2016-10-04 DIAGNOSIS — M999 Biomechanical lesion, unspecified: Secondary | ICD-10-CM

## 2016-10-04 NOTE — Progress Notes (Signed)
[name] [address] [place], [place] [address] Phone: [phone] Subjective:     CC: back pain follow up   BJY:NWGNFAOZHY  Yolanda Cooper is a 41 y.o. female coming in with complaint of back pain. Patient was diagnosed with piriformis syndrome as well as sacroiliac joint dysfunction. Patient has been responding to OMT.   Was having some worsening symptoms. Some increasing tightness. Continues to be the primary candidate for an children at home. Patient states that also removed and has been doing some other things around the new house. Some increasing stress as well. Noticing pain on the right side of the neck that seems to go up towards her head. Sometimes associated with headaches.  Lumbar x-rays are completely unremarkable taken April 2015 showed no significant findings.     Past Medical History:  Diagnosis Date  . Anxiety   . Fatty liver   . Nonspecific elevation of levels of transaminase and lactic acid dehydrogenase (LDH)   . Other chronic pain   . Overweight   . Rosacea   . Unspecified vitamin D deficiency    Past Surgical History:  Procedure Laterality Date  . CESAREAN SECTION    . CHOLECYSTECTOMY    . WISDOM TOOTH EXTRACTION     Social History  Substance Use Topics  . Smoking status: Never Smoker  . Smokeless tobacco: Never Used  . Alcohol use Yes     Comment: Rarely    No Known Allergies Family History  Problem Relation Age of Onset  . Diabetes Father   . Hyperlipidemia Father   . Hypertension Father   . Cancer Other        colon cancer   . Anuerysm Other   . Diabetes Paternal Grandmother    Review of Systems: No headache, visual changes, nausea, vomiting, diarrhea, constipation, dizziness, abdominal pain, skin rash, fevers, chills, night sweats, weight loss, swollen lymph nodes, body aches, joint swelling, muscle aches, chest pain, shortness of breath, mood changes.    Objective  Blood pressure 112/82, pulse  79, height 5\' 9"  (1.753 m), weight 179 lb (81.2 kg), SpO2 98 %.  Systems examined below as of 10/04/16 General: NAD A&O x3 mood, affect normal  HEENT: Pupils equal, extraocular movements intact no nystagmus Respiratory: not short of breath at rest or with speaking Cardiovascular: No lower extremity edema, non tender Skin: Warm dry intact with no signs of infection or rash on extremities or on axial skeleton. Abdomen: Soft nontender, no masses Neuro: Cranial nerves  intact, neurovascularly intact in all extremities with 2+ DTRs and 2+ pulses. Lymph: No lymphadenopathy appreciated today  Gait normal with good balance and coordination.  MSK: Non tender with full range of motion and good stability and symmetric strength and tone of shoulders, elbows, wrist,  knee hips and ankles bilaterally.   Back Exam:  Inspection Loss of lordosis Motion: Flexion 45 deg, Extension 20 deg, Side Bending to 35 deg bilaterally,  Rotation to 35 deg bilaterally  SLR laying: Negative  XSLR laying: Negative  Palpable tenderness: More discomfort in the cervical thoracic FABER: Positive right Sensory change: Gross sensation intact to all lumbar and sacral dermatomes.  Reflexes: 2+ at both patellar tendons, 2+ at achilles tendons, Babinski's downgoing.  Strength at foot  5 out of 5 but symmetric   Osteopathic findings C2 flexed rotated and side bent right C7 flexed rotated and side bent left T3 extended rotated and side bent right inhaled third rib T6 extended rotated  and side bent left L2 flexed rotated and side bent right Sacrum right on right     Impression and Recommendations:     This case required medical decision making of moderate complexity.

## 2016-10-04 NOTE — Patient Instructions (Signed)
Good to see you  Gustavus Bryantce is your friend.  Try to work on the posture when you can  See me again in 4-6 weeks.

## 2016-10-04 NOTE — Assessment & Plan Note (Signed)
Decision today to treat with OMT was based on Physical Exam  After verbal consent patient was treated with HVLA, ME, FPR techniques in cervical, thoracic, lumbar and sacral areas  Patient tolerated the procedure well with improvement in symptoms  Patient given exercises, stretches and lifestyle modifications  See medications in patient instructions if given  Patient will follow up in 4-6 weeks 

## 2016-10-04 NOTE — Assessment & Plan Note (Signed)
Stable overall. We discussed icing regimen and home exercises. We discussed which activities to do in which ones to avoid. Screws which activities to avoid. Patient come back and see me again in 4-6 weeks.

## 2016-11-06 ENCOUNTER — Ambulatory Visit (INDEPENDENT_AMBULATORY_CARE_PROVIDER_SITE_OTHER): Payer: BLUE CROSS/BLUE SHIELD | Admitting: Family Medicine

## 2016-11-06 ENCOUNTER — Encounter: Payer: Self-pay | Admitting: Family Medicine

## 2016-11-06 VITALS — BP 112/72 | HR 74 | Ht 69.0 in | Wt 179.8 lb

## 2016-11-06 DIAGNOSIS — M999 Biomechanical lesion, unspecified: Secondary | ICD-10-CM | POA: Diagnosis not present

## 2016-11-06 DIAGNOSIS — M533 Sacrococcygeal disorders, not elsewhere classified: Secondary | ICD-10-CM | POA: Diagnosis not present

## 2016-11-06 NOTE — Assessment & Plan Note (Signed)
Doing relatively well. Having some pain more in the mid thoracic spine. We discussed icing regimen and home exercises. Discussed objective is to do a which was to avoid. Patient will increase activity as tolerated. Follow-up with me again in 6 weeks

## 2016-11-06 NOTE — Patient Instructions (Signed)
Great to see you  Happy birthday to the little one! You are doing well overall.  Keep active See me again in 6 weeks!

## 2016-11-06 NOTE — Assessment & Plan Note (Signed)
Decision today to treat with OMT was based on Physical Exam  After verbal consent patient was treated with HVLA, ME, FPR techniques in cervical, thoracic, lumbar and sacral areas  Patient tolerated the procedure well with improvement in symptoms  Patient given exercises, stretches and lifestyle modifications  See medications in patient instructions if given  Patient will follow up in 6 weeks 

## 2016-11-06 NOTE — Progress Notes (Signed)
Tawana ScaleZach Nigel Ericsson D.O.  [facility] [address] [place], [place] [address] Phone: [phone] Subjective:     CC: back pain follow up   YQM:VHQIONGEXBHPI:Subjective  Yolanda Cooper is a 41 y.o. female coming in with complaint of back pain. Patient was diagnosed with piriformis syndrome as well as sacroiliac joint dysfunction. Patient has been responding to OMT.   Patient states that overall she still has some mid back pain. Neck seems to be doing somewhat better. Going out of town and did do a significant a lot of walking. Lumbar x-rays are completely unremarkable taken April 2015 showed no significant findings.     Past Medical History:  Diagnosis Date  . Anxiety   . Fatty liver   . Nonspecific elevation of levels of transaminase and lactic acid dehydrogenase (LDH)   . Other chronic pain   . Overweight   . Rosacea   . Unspecified vitamin D deficiency    Past Surgical History:  Procedure Laterality Date  . CESAREAN SECTION    . CHOLECYSTECTOMY    . WISDOM TOOTH EXTRACTION     Social History  Substance Use Topics  . Smoking status: Never Smoker  . Smokeless tobacco: Never Used  . Alcohol use Yes     Comment: Rarely    No Known Allergies Family History  Problem Relation Age of Onset  . Diabetes Father   . Hyperlipidemia Father   . Hypertension Father   . Cancer Other        colon cancer   . Anuerysm Other   . Diabetes Paternal Grandmother    Review of Systems: No headache, visual changes, nausea, vomiting, diarrhea, constipation, dizziness, abdominal pain, skin rash, fevers, chills, night sweats, weight loss, swollen lymph nodes, body aches, joint swelling, muscle aches, chest pain, shortness of breath, mood changes.    Objective  Blood pressure 112/72, pulse 74, height 5\' 9"  (1.753 m), weight 179 lb 12.8 oz (81.6 kg).  Systems examined below as of 11/06/16 General: NAD A&O x3 mood, affect normal  HEENT: Pupils equal, extraocular movements intact no  nystagmus Respiratory: not short of breath at rest or with speaking Cardiovascular: No lower extremity edema, non tender Skin: Warm dry intact with no signs of infection or rash on extremities or on axial skeleton. Abdomen: Soft nontender, no masses Neuro: Cranial nerves  intact, neurovascularly intact in all extremities with 2+ DTRs and 2+ pulses. Lymph: No lymphadenopathy appreciated today  Gait normal with good balance and coordination.  MSK: Non tender with full range of motion and good stability and symmetric strength and tone of shoulders, elbows, wrist,  knee hips and ankles bilaterally.  Mild increasing kyphosis of the upper thoracic  Back Exam:  Inspection: Mild loss of lordosis Motion: Flexion 45 deg, Extension 25 deg, Side Bending to 45 deg bilaterally,  Rotation to 45 deg bilaterally  SLR laying: Negative  XSLR laying: Negative  Palpable tenderness: Tender to palpation in the paraspinal musculature lumbar spine.Marland Kitchen. FABER: negative. Sensory change: Gross sensation intact to all lumbar and sacral dermatomes.  Reflexes: 2+ at both patellar tendons, 2+ at achilles tendons, Babinski's downgoing.  Strength at foot  Plantar-flexion: 5/5 Dorsi-flexion: 5/5 Eversion: 5/5 Inversion: 5/5  Leg strength  Quad: 5/5 Hamstring: 5/5 Hip flexor: 5/5 Hip abductors: 5/5  Gait unremarkable.    Osteopathic findings C2 flexed rotated and side bent right C4 flexed rotated and side bent left C7 flexed rotated and side bent left T3 extended rotated and side bent right inhaled  third rib T6 extended rotated and side bent left L2 flexed rotated and side bent right Sacrum left on left      Impression and Recommendations:     This case required medical decision making of moderate complexity.

## 2016-12-18 ENCOUNTER — Ambulatory Visit: Payer: BLUE CROSS/BLUE SHIELD | Admitting: Family Medicine

## 2017-09-03 ENCOUNTER — Emergency Department
Admission: EM | Admit: 2017-09-03 | Discharge: 2017-09-03 | Disposition: A | Discharge status: Home / Self Care | Payer: BLUE CROSS/BLUE SHIELD | Source: Home / Self Care | Attending: Family Medicine | Admitting: Family Medicine

## 2017-09-03 ENCOUNTER — Other Ambulatory Visit: Payer: Self-pay

## 2017-09-03 ENCOUNTER — Encounter: Payer: Self-pay | Admitting: *Deleted

## 2017-09-03 DIAGNOSIS — H6121 Impacted cerumen, right ear: Secondary | ICD-10-CM | POA: Diagnosis not present

## 2017-09-03 DIAGNOSIS — H6983 Other specified disorders of Eustachian tube, bilateral: Secondary | ICD-10-CM | POA: Diagnosis not present

## 2017-09-03 MED ORDER — PREDNISONE 20 MG PO TABS: ORAL_TABLET | ORAL | 0 refills | Status: DC

## 2017-09-03 NOTE — ED Provider Notes (Signed)
[facility]    CSN: [id number] Arrival date & time: 09/03/17  1644     History   Chief Complaint Chief Complaint  Patient presents with  . Otalgia    HPI Yolanda Cooper is a 42 y.o. female.   Patient complains of intermittent sore throat during the past 3 to 4 weeks without fever or URI symptoms.  He has had intermittent bilateral earache and sensation of ear fullness.  The history is provided by the patient.    Past Medical History:  Diagnosis Date  . Anxiety   . Fatty liver   . Nonspecific elevation of levels of transaminase and lactic acid dehydrogenase (LDH)   . Other chronic pain   . Overweight   . Rosacea   . Unspecified vitamin D deficiency     Patient Active Problem List   Diagnosis Date Noted  . Piriformis syndrome of right side 05/11/2014  . SI (sacroiliac) joint dysfunction 07/30/2013  . Adjustment disorder with mixed anxiety and depressed mood 07/15/2013  . Other malaise and fatigue 07/15/2013  . Back pain 07/14/2013  . Nonallopathic lesion of cervical region 07/14/2013  . Nonallopathic lesion of thoracic region 07/14/2013  . Nonallopathic lesion of lumbosacral region 07/14/2013  . Backache 04/28/2013  . Overweight (BMI 25.0-29.9) 04/08/2013  . Need for prophylactic vaccination and inoculation against influenza 03/22/2013  . Fatty liver disease, nonalcoholic 11/29/2012  . DEPRESSION 02/18/2010    Past Surgical History:  Procedure Laterality Date  . CESAREAN SECTION    . CHOLECYSTECTOMY    . WISDOM TOOTH EXTRACTION      OB History   None      Home Medications    Prior to Admission medications   Medication Sig Start Date End Date Taking? Authorizing Provider  desloratadine (CLARINEX) 5 MG tablet Take 5 mg by mouth daily.   Yes [provider]  citalopram (CELEXA) 20 MG tablet Take 20 mg by mouth daily. 06/01/17   [provider]  doxycycline (VIBRAMYCIN) 50 MG capsule TAKE ONE CAPSULE EVERY DAY AS  NEEDED 08/19/17   [provider]  predniSONE (DELTASONE) 20 MG tablet Take one tab by mouth twice daily for 4 days, then one daily. Take with food. 09/03/17   Lattie Haw, MD    Family History Family History  Problem Relation Age of Onset  . Diabetes Father   . Hyperlipidemia Father   . Hypertension Father   . Cancer Other        colon cancer   . Anuerysm Other   . Diabetes Paternal Grandmother     Social History Social History   Tobacco Use  . Smoking status: Never Smoker  . Smokeless tobacco: Never Used  Substance Use Topics  . Alcohol use: Yes    Comment: Rarely   . Drug use: No     Allergies   Other and Influenza vaccines   Review of Systems Review of Systems + sore throat No cough No pleuritic pain No wheezing + nasal congestion No post-nasal drainage No sinus pain/pressure No itchy/red eyes ? earache No hemoptysis No SOB No fever/chills No nausea No vomiting No abdominal pain No diarrhea No urinary symptoms No skin rash No fatigue No myalgias No headache Used OTC meds without relief   Physical Exam Triage Vital Signs ED Triage Vitals  Enc Vitals Group     BP 09/03/17 1710 110/70     Pulse Rate 09/03/17 1710 79     Resp 09/03/17 1710 16  Temp 09/03/17 1710 98.3 F (36.8 C)     Temp Source 09/03/17 1710 Oral     SpO2 09/03/17 1710 100 %     Weight 09/03/17 1711 173 lb (78.5 kg)     Height 09/03/17 1711  (1.753 m)     Head Circumference --      Peak Flow --      Pain Score 09/03/17 1711 3     Pain Loc --      Pain Edu? --      Excl. in GC? --    No data found.  Updated Vital Signs BP 110/70 (BP Location: Right Arm)   Pulse 79   Temp 98.3 F (36.8 C) (Oral)   Resp 16   Ht  (1.753 m)   Wt 173 lb (78.5 kg)   SpO2 100%   BMI 25.55 kg/m   Visual Acuity Right Eye Distance:   Left Eye Distance:   Bilateral Distance:    Right Eye Near:   Left Eye Near:    Bilateral Near:     Physical  Exam Nursing notes and Vital Signs reviewed. Appearance:  Patient appears stated age, and in no acute distress Eyes:  Pupils are equal, round, and reactive to light and accomodation.  Extraocular movement is intact.  Conjunctivae are not inflamed  Ears:   Right canal occluded with cerumen.  Post lavage, right canal and tympanic membrane normal.  Left canal and tympanic membrane normal. Nose:   Normal turbinates.  No sinus tenderness.   Pharynx:  Normal Neck:  Supple.  No adenopathy Lungs:   Normal respiration Heart:   Normal rate  Skin:  No rash present.    UC Treatments / Results  Labs (all labs ordered are listed, but only abnormal results are displayed) Labs Reviewed -  Tympanometry:  Right ear tympanogram normal; Left ear tympanogram normal  EKG None  Radiology No results found.  Procedures Procedures (including critical care time)  Medications Ordered in UC Medications - No data to display  Initial Impression / Assessment and Plan / UC Course  I have reviewed the triage vital signs and the nursing notes.  Pertinent labs & imaging results that were available during my care of the patient were reviewed by me and considered in my medical decision making (see chart for details).    There is no evidence of bacterial infection today.   Begin prednisone burst/taper. Followup with ENT if symptoms persist.   Final Clinical Impressions(s) / UC Diagnoses   Final diagnoses:  Impacted cerumen of right ear  Eustachian tube dysfunction, bilateral     Discharge Instructions     May add Pseudoephedrine ( , one or two every 4 to 6 hours) for sinus congestion.  May use Afrin nasal spray (or generic oxymetazoline) each morning for about 5 days and then discontinue.  Also recommend using saline nasal spray several times daily and saline nasal irrigation (AYR is a common brand).  Use Flonase nasal spray each morning after using Afrin nasal spray and saline nasal irrigation. May  continue antihistamine such as Claritin or Zyrtec.  To soften ear wax, try the following: Soak two cotton balls with mineral oil, and gently place in each ear canal twice monthly.  Leave the cotton balls in place for 10 to 20 minutes.  This will help liquefy the ear wax and aid your body's normal elimination process.  If applicable, do not use a hearing aid for 8 hours overnight.  Have  your ears cleaned by a health professional every 6 to 12 months.  Avoid using "Q-tips" and ear wax softening solutions     ED Prescriptions    Medication Sig Dispense Auth. Provider   predniSONE (DELTASONE) 20 MG tablet Take one tab by mouth twice daily for 4 days, then one daily. Take with food. 12 tablet Lattie Haw, MD        Lattie Haw, MD 09/07/17 [phone]

## 2017-09-03 NOTE — ED Triage Notes (Signed)
Pt c/o bilateral ear pain and intermittent sore throat x 1 mth.

## 2017-09-03 NOTE — Discharge Instructions (Addendum)
May add Pseudoephedrine ( , one or two every 4 to 6 hours) for sinus congestion.  May use Afrin nasal spray (or generic oxymetazoline) each morning for about 5 days and then discontinue.  Also recommend using saline nasal spray several times daily and saline nasal irrigation (AYR is a common brand).  Use Flonase nasal spray each morning after using Afrin nasal spray and saline nasal irrigation. May continue antihistamine such as Claritin or Zyrtec.  To soften ear wax, try the following: Soak two cotton balls with mineral oil, and gently place in each ear canal twice monthly.  Leave the cotton balls in place for 10 to 20 minutes.  This will help liquefy the ear wax and aid your body's normal elimination process.  If applicable, do not use a hearing aid for 8 hours overnight.  Have your ears cleaned by a health professional every 6 to 12 months.  Avoid using "Q-tips" and ear wax softening solutions

## 2019-09-05 ENCOUNTER — Encounter: Payer: Self-pay | Admitting: Emergency Medicine

## 2019-09-05 ENCOUNTER — Emergency Department
Admission: EM | Admit: 2019-09-05 | Discharge: 2019-09-05 | Disposition: A | Discharge status: Home / Self Care | Payer: BLUE CROSS/BLUE SHIELD | Source: Home / Self Care

## 2019-09-05 ENCOUNTER — Other Ambulatory Visit: Payer: Self-pay

## 2019-09-05 DIAGNOSIS — J0100 Acute maxillary sinusitis, unspecified: Secondary | ICD-10-CM

## 2019-09-05 DIAGNOSIS — J069 Acute upper respiratory infection, unspecified: Secondary | ICD-10-CM

## 2019-09-05 MED ORDER — BENZONATATE 100 MG PO CAPS: 100.0000 mg | ORAL_CAPSULE | Freq: Three times a day (TID) | ORAL | 0 refills | Status: DC

## 2019-09-05 MED ORDER — DOXYCYCLINE HYCLATE 100 MG PO CAPS: 100.0000 mg | ORAL_CAPSULE | Freq: Two times a day (BID) | ORAL | 0 refills | Status: DC

## 2019-09-05 MED ORDER — FLUTICASONE PROPIONATE 50 MCG/ACT NA SUSP: 2.0000 | Freq: Every day | NASAL | 2 refills | Status: DC

## 2019-09-05 MED ORDER — AMOXICILLIN-POT CLAVULANATE 875-125 MG PO TABS: 1.0000 | ORAL_TABLET | Freq: Two times a day (BID) | ORAL | 0 refills | Status: DC

## 2019-09-05 NOTE — ED Triage Notes (Signed)
Patient c/o productive cough worse at night, sore throat, headache, stuffy nose, pressure under eyes, doesn't feel 100%.  Take OTC meds with minimal help.

## 2019-09-05 NOTE — Discharge Instructions (Signed)

## 2019-09-05 NOTE — ED Provider Notes (Signed)
[facility]    CSN: [id number] Arrival date & time: 09/05/19  0957      History   Chief Complaint Chief Complaint  Patient presents with  . URI    HPI Yolanda Cooper is a 44 y.o. female.   HPI Yolanda Cooper is a 44 y.o. female presenting to UC with c/o 1 week of gradually worsening productive cough, worse at night, sore throat, generalized HA, stuffy nose and pressure under her eyes.  She has taken OTC medication w/o relief. No known sick contacts. No recent travel. Denies fever, chills, n/v/d. No chest pain or SOB.   Past Medical History:  Diagnosis Date  . Anxiety   . Fatty liver   . Nonspecific elevation of levels of transaminase and lactic acid dehydrogenase (LDH)   . Other chronic pain   . Overweight   . Rosacea   . Unspecified vitamin D deficiency     Patient Active Problem List   Diagnosis Date Noted  . Piriformis syndrome of right side 05/11/2014  . SI (sacroiliac) joint dysfunction 07/30/2013  . Adjustment disorder with mixed anxiety and depressed mood 07/15/2013  . Other malaise and fatigue 07/15/2013  . Back pain 07/14/2013  . Nonallopathic lesion of cervical region 07/14/2013  . Nonallopathic lesion of thoracic region 07/14/2013  . Nonallopathic lesion of lumbosacral region 07/14/2013  . Backache 04/28/2013  . Overweight (BMI 25.0-29.9) 04/08/2013  . Need for prophylactic vaccination and inoculation against influenza 03/22/2013  . Fatty liver disease, nonalcoholic 11/29/2012  . DEPRESSION 02/18/2010    Past Surgical History:  Procedure Laterality Date  . CESAREAN SECTION    . CHOLECYSTECTOMY    . WISDOM TOOTH EXTRACTION      OB History   No obstetric history on file.      Home Medications    Prior to Admission medications   Medication Sig Start Date End Date Taking? Authorizing Provider  citalopram (CELEXA) 20 MG tablet Take 20 mg by mouth daily. 06/01/17  Yes [provider]  omeprazole (PRILOSEC)  20 MG capsule TAKE 1 CAPSULE BY MOUTH EVERY DAY 07/27/18  Yes [provider]  benzonatate (TESSALON) 100 MG capsule Take 1-2 capsules (100-200 mg total) by mouth every 8 (eight) hours. 09/05/19   Lurene Shadow, PA-C  desloratadine (CLARINEX) 5 MG tablet Take 5 mg by mouth daily.    [provider]  doxycycline (VIBRAMYCIN) 100 MG capsule Take 1 capsule (100 mg total) by mouth 2 (two) times daily. One po bid x 7 days 09/05/19   Lurene Shadow, PA-C  fluticasone [facility]) 50 MCG/ACT nasal spray Place 2 sprays into both nostrils daily. 09/05/19   Lurene Shadow, PA-C  predniSONE (DELTASONE) 20 MG tablet Take one tab by mouth twice daily for 4 days, then one daily. Take with food. 09/03/17   Lattie Haw, MD    Family History Family History  Problem Relation Age of Onset  . Diabetes Father   . Hyperlipidemia Father   . Hypertension Father   . Cancer Other        colon cancer   . Anuerysm Other   . Diabetes Paternal Grandmother     Social History Social History   Tobacco Use  . Smoking status: Never Smoker  . Smokeless tobacco: Never Used  Substance Use Topics  . Alcohol use: Yes    Comment: Rarely   . Drug use: No     Allergies   Other and Influenza vaccines   Review  of Systems Review of Systems  Constitutional: Negative for chills and fever.  HENT: Positive for congestion and sore throat. Negative for ear pain, trouble swallowing and voice change.   Respiratory: Positive for cough. Negative for shortness of breath.   Cardiovascular: Negative for chest pain and palpitations.  Gastrointestinal: Negative for abdominal pain, diarrhea, nausea and vomiting.  Musculoskeletal: Negative for arthralgias, back pain and myalgias.  Skin: Negative for rash.  Neurological: Positive for headaches. Negative for dizziness and light-headedness.  All other systems reviewed and are negative.    Physical Exam Triage Vital Signs ED Triage Vitals [09/05/19 1029]  Enc  Vitals Group     BP 113/79     Pulse Rate 86     Resp      Temp 98.4 F (36.9 C)     Temp Source Oral     SpO2 97 %     Weight      Height      Head Circumference      Peak Flow      Pain Score 0     Pain Loc      Pain Edu?      Excl. in [place]?    No data found.  Updated Vital Signs BP 113/79 (BP Location: Left Arm)   Pulse 86   Temp 98.4 F (36.9 C) (Oral)   SpO2 97%   Visual Acuity Right Eye Distance:   Left Eye Distance:   Bilateral Distance:    Right Eye Near:   Left Eye Near:    Bilateral Near:     Physical Exam Vitals and nursing note reviewed.  Constitutional:      Appearance: Normal appearance. She is well-developed.  HENT:     Head: Normocephalic and atraumatic.     Right Ear: Tympanic membrane and ear canal normal.     Left Ear: Tympanic membrane and ear canal normal.     Nose: Mucosal edema present.     Right Sinus: Maxillary sinus tenderness present. No frontal sinus tenderness.     Left Sinus: Maxillary sinus tenderness present. No frontal sinus tenderness.     Mouth/Throat:     Lips: Pink.     Mouth: Mucous membranes are moist.     Pharynx: Oropharynx is clear. Uvula midline.  Cardiovascular:     Rate and Rhythm: Normal rate and regular rhythm.  Pulmonary:     Effort: Pulmonary effort is normal. No respiratory distress.     Breath sounds: Normal breath sounds. No stridor. No wheezing, rhonchi or rales.  Musculoskeletal:        General: Normal range of motion.     Cervical back: Normal range of motion.  Skin:    General: Skin is warm and dry.  Neurological:     Mental Status: She is alert and oriented to person, place, and time.  Psychiatric:        Behavior: Behavior normal.      UC Treatments / Results  Labs (all labs ordered are listed, but only abnormal results are displayed) Labs Reviewed - No data to display  EKG   Radiology No results found.  Procedures Procedures (including critical care time)  Medications Ordered in  UC Medications - No data to display  Initial Impression / Assessment and Plan / UC Course  I have reviewed the triage vital signs and the nursing notes.  Pertinent labs & imaging results that were available during my care of the patient were reviewed by me and  considered in my medical decision making (see chart for details).     Pt appears well, NAD. Due to worsening symptoms, will cover for bacterial sinus infection AVS provided  Final Clinical Impressions(s) / UC Diagnoses   Final diagnoses:  Upper respiratory tract infection, unspecified type  Acute non-recurrent maxillary sinusitis     Discharge Instructions      Please take antibiotics as prescribed and be sure to complete entire course even if you start to feel better to ensure infection does not come back.  You may take 500mg  acetaminophen every 4-6 hours or in combination with ibuprofen 400-600mg  every 6-8 hours as needed for pain, inflammation, and fever.  Be sure to well hydrated with clear liquids and get at least 8 hours of sleep at night, preferably more while sick.   Please follow up with family medicine in 1 week if needed.     ED Prescriptions    Medication Sig Dispense Auth. Provider   amoxicillin-clavulanate (AUGMENTIN) 875-125 MG tablet  (Status: Discontinued) Take 1 tablet by mouth 2 (two) times daily. One po bid x 7 days 14 tablet Tyquavious Gamel O, PA-C   fluticasone (FLONASE) 50 MCG/ACT nasal spray Place 2 sprays into both nostrils daily. 16 g Baeleigh Devincent O, PA-C   benzonatate (TESSALON) 100 MG capsule Take 1-2 capsules (100-200 mg total) by mouth every 8 (eight) hours. 21 capsule 01-15-2000 O, PA-C   doxycycline (VIBRAMYCIN) 100 MG capsule Take 1 capsule (100 mg total) by mouth 2 (two) times daily. One po bid x 7 days 14 capsule Waylan Rocher, Lurene Shadow     PDMP not reviewed this encounter.   [place], Lurene Shadow 09/09/19 [phone]

## 2021-01-29 ENCOUNTER — Emergency Department
Admission: RE | Admit: 2021-01-29 | Discharge: 2021-01-29 | Disposition: A | Discharge status: Home / Self Care | Payer: BC Managed Care – PPO | Source: Ambulatory Visit

## 2021-01-29 ENCOUNTER — Other Ambulatory Visit: Payer: Self-pay

## 2021-01-29 VITALS — BP 114/78 | HR 85 | Temp 98.0°F | Resp 16

## 2021-01-29 DIAGNOSIS — R058 Other specified cough: Secondary | ICD-10-CM

## 2021-01-29 MED ORDER — PREDNISONE 20 MG PO TABS: ORAL_TABLET | ORAL | 0 refills | Status: DC

## 2021-01-29 MED ORDER — DM-GUAIFENESIN ER 30-600 MG PO TB12: 1.0000 | ORAL_TABLET | Freq: Two times a day (BID) | ORAL | 0 refills | Status: DC

## 2021-01-29 MED ORDER — BENZONATATE 200 MG PO CAPS: 200.0000 mg | ORAL_CAPSULE | Freq: Two times a day (BID) | ORAL | 0 refills | Status: DC | PRN

## 2021-01-29 NOTE — Discharge Instructions (Addendum)
Drink lots of fluids Run a humidifier if you have one For the cough take both Tessalon and Mucinex DM every 12 hours Take the prednisone as directed I would expect to see improvement within a couple of days.  Cough should run its course and be gone within the week.

## 2021-01-29 NOTE — ED Triage Notes (Signed)
Pt present coughing with chest congestion. Symptoms started three weeks ago. Pt states symptoms started with productive to now non -productive and heavy chest congestion.

## 2021-01-30 NOTE — ED Provider Notes (Signed)
[facility]    CSN: [id number] Arrival date & time: 07/01/11  1302      History   Chief Complaint Chief Complaint  Patient presents with   Cough    HPI Ilma Achee is a 45 y.o. female.   HPI  Patient states that she has had a cough for about a month.  Started off with typical cold symptoms.  That a cough developed.  Everything else is better except for the cough.  She continues to cough on a daily basis.  It makes it hard for her to do her job during the day, hard for her to sleep at night.  No chest pain.  No sputum production.  No headache or body ache.  No other feeling of illness.  Persistent cough is her only symptom  Past Medical History:  Diagnosis Date   Anxiety    Fatty liver    Nonspecific elevation of levels of transaminase and lactic acid dehydrogenase (LDH)    Other chronic pain    Overweight    Rosacea    Unspecified vitamin D deficiency     Patient Active Problem List   Diagnosis Date Noted   Piriformis syndrome of right side 05/11/2014   SI (sacroiliac) joint dysfunction 07/30/2013   Adjustment disorder with mixed anxiety and depressed mood 07/15/2013   Other malaise and fatigue 07/15/2013   Back pain 07/14/2013   Nonallopathic lesion of cervical region 07/14/2013   Nonallopathic lesion of thoracic region 07/14/2013   Nonallopathic lesion of lumbosacral region 07/14/2013   Backache 04/28/2013   Overweight (BMI 25.0-29.9) 04/08/2013   Need for prophylactic vaccination and inoculation against influenza 03/22/2013   Fatty liver disease, nonalcoholic 11/29/2012   DEPRESSION 02/18/2010    Past Surgical History:  Procedure Laterality Date   CESAREAN SECTION     CHOLECYSTECTOMY     WISDOM TOOTH EXTRACTION      OB History   No obstetric history on file.      Home Medications    Prior to Admission medications   Medication Sig Start Date End Date Taking? Authorizing Provider  benzonatate (TESSALON) 200 MG capsule Take 1  capsule (200 mg total) by mouth 2 (two) times daily as needed for cough. 01/29/21   Eustace Moore, MD  citalopram (CELEXA) 20 MG tablet Take 20 mg by mouth daily. 06/01/17   [provider]  desloratadine (CLARINEX) 5 MG tablet Take 5 mg by mouth daily.    [provider]  dextromethorphan-guaiFENesin (MUCINEX DM) 30-600 MG 12hr tablet Take 1 tablet by mouth 2 (two) times daily. 01/29/21   Eustace Moore, MD  fluticasone [facility]) 50 MCG/ACT nasal spray Place 2 sprays into both nostrils daily. 09/05/19   Lurene Shadow, PA-C  omeprazole (PRILOSEC) 20 MG capsule TAKE 1 CAPSULE BY MOUTH EVERY DAY 07/27/18   [provider]  predniSONE (DELTASONE) 20 MG tablet Take one tab by mouth twice daily for 4 days, then one daily. Take with food. 01/29/21   Eustace Moore, MD    Family History Family History  Problem Relation Age of Onset   Diabetes Father    Hyperlipidemia Father    Hypertension Father    Cancer Other        colon cancer    Anuerysm Other    Diabetes Paternal Grandmother     Social History Social History   Tobacco Use   Smoking status: Never   Smokeless tobacco: Never  Vaping Use   Vaping  Use: Never used  Substance Use Topics   Alcohol use: Yes    Comment: Rarely    Drug use: No     Allergies   Other and Influenza vaccines   Review of Systems Review of Systems See HPI  Physical Exam Triage Vital Signs ED Triage Vitals [07/01/11 1323]  Enc Vitals Group     BP 115/81     Pulse Rate 105     Resp 18     Temp 98.8 F (37.1 C)     Temp Source Oral     SpO2 98 %     Weight 183 lb (83 kg)     Height 5\' 9"  (1.753 m)     Head Circumference      Peak Flow      Pain Score      Pain Loc      Pain Edu?      Excl. in GC?    No data found.  Updated Vital Signs BP 115/81 (BP Location: Left arm)   Pulse 105   Temp 98.8 F (37.1 C) (Oral)   Resp 18   Ht 5\' 9"  (1.753 m)   Wt 83 kg   LMP 06/09/2011   SpO2 98%   BMI 27.02  kg/m       Physical Exam Constitutional:      General: She is not in acute distress.    Appearance: She is well-developed.  HENT:     Head: Normocephalic and atraumatic.     Right Ear: Tympanic membrane, ear canal and external ear normal.     Left Ear: Tympanic membrane, ear canal and external ear normal.     Nose: No congestion or rhinorrhea.     Mouth/Throat:     Pharynx: Posterior oropharyngeal erythema present.  Eyes:     Conjunctiva/sclera: Conjunctivae normal.     Pupils: Pupils are equal, round, and reactive to light.  Cardiovascular:     Rate and Rhythm: Normal rate and regular rhythm.     Heart sounds: Normal heart sounds.  Pulmonary:     Effort: Pulmonary effort is normal. No respiratory distress.     Breath sounds: No wheezing or rales.  Abdominal:     General: There is no distension.     Palpations: Abdomen is soft.  Musculoskeletal:        General: Normal range of motion.     Cervical back: Normal range of motion.  Skin:    General: Skin is warm and dry.  Neurological:     Mental Status: She is alert.     UC Treatments / Results  Labs (all labs ordered are listed, but only abnormal results are displayed) Labs Reviewed - No data to display  EKG   Radiology No results found.  Procedures Procedures (including critical care time)  Medications Ordered in UC Medications - No data to display  Initial Impression / Assessment and Plan / UC Course  I have reviewed the triage vital signs and the nursing notes.  Pertinent labs & imaging results that were available during my care of the patient were reviewed by me and considered in my medical decision making (see chart for details).     Reviewed that her viral infection has cleared but left her with a post viral cough.  Talked about expected but. Final Clinical Impressions(s) / UC Diagnoses   Final diagnoses:  Acute upper respiratory infections of unspecified site     Discharge Instructions  Take Mucinex D (guaifenesin with decongestant) twice daily for congestion.  Increase fluid intake, rest. May use Afrin nasal spray (or generic oxymetazoline) twice daily for about 5 days.  Also recommend using saline nasal spray several times daily and saline nasal irrigation (AYR is a common brand) Stop all antihistamines for now, and other non-prescription cough/cold preparations. May take Ibuprofen 200mg , 4 tabs every 8 hours with food for chest/sternum discomfort. Recommend a Tdap when well.  Follow-up with family doctor if not improving one week.   ED Prescriptions     Medication Sig Dispense Auth. Provider   azithromycin (ZITHROMAX Z-PAK) 250 MG tablet Take 2 tabs today; then begin one tab once daily for 4 more days. 6 each , MD   benzonatate (TESSALON) 200 MG capsule Take 1 capsule (200 mg total) by mouth at bedtime. Take as needed for cough 12 capsule Lattie Haw, MD      PDMP not reviewed this encounter.   Lattie Haw, MD 01/30/21 1425

## 2021-06-02 ENCOUNTER — Other Ambulatory Visit: Payer: Self-pay

## 2021-06-02 ENCOUNTER — Emergency Department (INDEPENDENT_AMBULATORY_CARE_PROVIDER_SITE_OTHER)
Admission: RE | Admit: 2021-06-02 | Discharge: 2021-06-02 | Disposition: A | Discharge status: Home / Self Care | Payer: BC Managed Care – PPO | Source: Ambulatory Visit

## 2021-06-02 VITALS — BP 113/59 | HR 84 | Temp 98.4°F | Resp 18 | Wt 180.0 lb

## 2021-06-02 DIAGNOSIS — J0100 Acute maxillary sinusitis, unspecified: Secondary | ICD-10-CM

## 2021-06-02 DIAGNOSIS — R059 Cough, unspecified: Secondary | ICD-10-CM

## 2021-06-02 DIAGNOSIS — J3489 Other specified disorders of nose and nasal sinuses: Secondary | ICD-10-CM

## 2021-06-02 MED ORDER — PREDNISONE 20 MG PO TABS: ORAL_TABLET | ORAL | 0 refills | Status: DC

## 2021-06-02 MED ORDER — BENZONATATE 200 MG PO CAPS: 200.0000 mg | ORAL_CAPSULE | Freq: Three times a day (TID) | ORAL | 0 refills | Status: AC | PRN

## 2021-06-02 MED ORDER — AMOXICILLIN-POT CLAVULANATE 875-125 MG PO TABS: 1.0000 | ORAL_TABLET | Freq: Two times a day (BID) | ORAL | 0 refills | Status: AC

## 2021-06-02 NOTE — ED Provider Notes (Signed)
[facility]    CSN: [id number] Arrival date & time: 06/02/21  6295      History   Chief Complaint Chief Complaint  Patient presents with   Cough    HPI Yolanda Cooper is a 46 y.o. female.   HPI 47 year old female presents with cough and congestion for 3 days.  Reports that her lymph nodes are swollen and all 3 of her children are sick.  PMH significant for overweight and rosacea  Past Medical History:  Diagnosis Date   Anxiety    Fatty liver    Nonspecific elevation of levels of transaminase and lactic acid dehydrogenase (LDH)    Other chronic pain    Overweight    Rosacea    Unspecified vitamin D deficiency     Patient Active Problem List   Diagnosis Date Noted   Piriformis syndrome of right side 05/11/2014   SI (sacroiliac) joint dysfunction 07/30/2013   Adjustment disorder with mixed anxiety and depressed mood 07/15/2013   Other malaise and fatigue 07/15/2013   Back pain 07/14/2013   Nonallopathic lesion of cervical region 07/14/2013   Nonallopathic lesion of thoracic region 07/14/2013   Nonallopathic lesion of lumbosacral region 07/14/2013   Backache 04/28/2013   Overweight (BMI 25.0-29.9) 04/08/2013   Need for prophylactic vaccination and inoculation against influenza 03/22/2013   Fatty liver disease, nonalcoholic 11/29/2012   DEPRESSION 02/18/2010    Past Surgical History:  Procedure Laterality Date   CESAREAN SECTION     CHOLECYSTECTOMY     WISDOM TOOTH EXTRACTION      OB History   No obstetric history on file.      Home Medications    Prior to Admission medications   Medication Sig Start Date End Date Taking? Authorizing Provider  amoxicillin-clavulanate (AUGMENTIN) 875-125 MG tablet Take 1 tablet by mouth 2 (two) times daily for 7 days. 06/02/21 06/09/21 Yes Trevor Iha, FNP  benzonatate (TESSALON) 200 MG capsule Take 1 capsule (200 mg total) by mouth 3 (three) times daily as needed for up to 7 days for cough. 06/02/21  06/09/21 Yes Trevor Iha, FNP  predniSONE (DELTASONE) 20 MG tablet Take 3 tabs PO daily x 5 days. 06/02/21  Yes Trevor Iha, FNP  citalopram (CELEXA) 20 MG tablet Take 20 mg by mouth daily. 06/01/17   [provider]  desloratadine (CLARINEX) 5 MG tablet Take 5 mg by mouth daily.    [provider]  fluticasone (FLONASE) 50 MCG/ACT nasal spray Place 2 sprays into both nostrils daily. 09/05/19   Lurene Shadow, PA-C  omeprazole (PRILOSEC) 20 MG capsule TAKE 1 CAPSULE BY MOUTH EVERY DAY 07/27/18   [provider]    Family History Family History  Problem Relation Age of Onset   Diabetes Father    Hyperlipidemia Father    Hypertension Father    Cancer Other        colon cancer    Anuerysm Other    Diabetes Paternal Grandmother     Social History Social History   Tobacco Use   Smoking status: Never   Smokeless tobacco: Never  Vaping Use   Vaping Use: Never used  Substance Use Topics   Alcohol use: Yes    Comment: Rarely    Drug use: No     Allergies   Other and Influenza vaccines   Review of Systems Review of Systems  HENT:  Positive for congestion, sinus pressure and sinus pain.   Respiratory:  Positive for cough.   All  other systems reviewed and are negative.   Physical Exam Triage Vital Signs ED Triage Vitals  Enc Vitals Group     BP 06/02/21 1016 (!) 113/59     Pulse Rate 06/02/21 1016 84     Resp 06/02/21 1016 18     Temp 06/02/21 1016 98.4 F (36.9 C)     Temp Source 06/02/21 1016 Oral     SpO2 06/02/21 1016 99 %     Weight 06/02/21 1018 180 lb (81.6 kg)     Height --      Head Circumference --      Peak Flow --      Pain Score 06/02/21 1018 4     Pain Loc --      Pain Edu? --      Excl. in GC? --    No data found.  Updated Vital Signs BP (!) 113/59 (BP Location: Right Arm)    Pulse 84    Temp 98.4 F (36.9 C) (Oral)    Resp 18    Wt 180 lb (81.6 kg)    SpO2 99%    BMI 26.58 kg/m    Physical Exam Vitals and  nursing note reviewed.  Constitutional:      General: She is not in acute distress.    Appearance: She is obese. She is not ill-appearing.  HENT:     Head: Normocephalic and atraumatic.     Right Ear: Tympanic membrane and external ear normal.     Left Ear: Tympanic membrane and external ear normal.     Ears:     Comments: Moderate eustachian tube dysfunction noted bilaterally    Nose:     Right Sinus: Maxillary sinus tenderness present.     Left Sinus: Maxillary sinus tenderness present.     Comments: Turbinates are erythematous/edematous    Mouth/Throat:     Mouth: Mucous membranes are moist.     Pharynx: Oropharynx is clear.  Eyes:     Extraocular Movements: Extraocular movements intact.     Conjunctiva/sclera: Conjunctivae normal.     Pupils: Pupils are equal, round, and reactive to light.  Cardiovascular:     Rate and Rhythm: Normal rate and regular rhythm.     Pulses: Normal pulses.     Heart sounds: Normal heart sounds.  Pulmonary:     Effort: Pulmonary effort is normal.     Breath sounds: Normal breath sounds.  Musculoskeletal:     Cervical back: Normal range of motion and neck supple. Tenderness present.  Lymphadenopathy:     Cervical: Cervical adenopathy present.  Skin:    General: Skin is warm and dry.  Neurological:     General: No focal deficit present.     Mental Status: She is alert and oriented to person, place, and time.     UC Treatments / Results  Labs (all labs ordered are listed, but only abnormal results are displayed) Labs Reviewed - No data to display  EKG   Radiology No results found.  Procedures Procedures (including critical care time)  Medications Ordered in UC Medications - No data to display  Initial Impression / Assessment and Plan / UC Course  I have reviewed the triage vital signs and the nursing notes.  Pertinent labs & imaging results that were available during my care of the patient were reviewed by me and considered in  my medical decision making (see chart for details).     MDM: 1.  Acute maxillary sinusitis, recurrence  not specified-Rx'd Augmentin; 2.  Sinus pressure-Rx'd prednisone; 3.  Cough-Rx'd Tessalon Perles. Advised patient to take medication as directed with food to completion.  Advised patient to take prednisone with first dose of Augmentin for the next 5 of 7 days.  Encouraged patient to increase daily water intake while taking these medications.  Advised patient may take Tessalon Perles daily or as needed for cough.  Patient discharged home, hemodynamically stable. Final Clinical Impressions(s) / UC Diagnoses   Final diagnoses:  Cough, unspecified type  Acute maxillary sinusitis, recurrence not specified  Sinus pressure     Discharge Instructions      Advised patient to take medication as directed with food to completion.  Advised patient to take prednisone with first dose of Augmentin for the next 5 of 7 days.  Encouraged patient to increase daily water intake while taking these medications.  Advised patient may take Tessalon Perles daily or as needed for cough.     ED Prescriptions     Medication Sig Dispense Auth. Provider   amoxicillin-clavulanate (AUGMENTIN) 875-125 MG tablet Take 1 tablet by mouth 2 (two) times daily for 7 days. 14 tablet Trevor Iha, FNP   predniSONE (DELTASONE) 20 MG tablet Take 3 tabs PO daily x 5 days. 15 tablet Trevor Iha, FNP   benzonatate (TESSALON) 200 MG capsule Take 1 capsule (200 mg total) by mouth 3 (three) times daily as needed for up to 7 days for cough. 40 capsule Trevor Iha, FNP      PDMP not reviewed this encounter.   Trevor Iha, FNP 06/02/21 1113

## 2021-06-02 NOTE — Discharge Instructions (Addendum)
Advised patient to take medication as directed with food to completion.  Advised patient to take prednisone with first dose of Augmentin for the next 5 of 7 days.  Encouraged patient to increase daily water intake while taking these medications.  Advised patient may take Tessalon Perles daily or as needed for cough.

## 2021-06-02 NOTE — ED Triage Notes (Signed)
Pt c/o cough with congestion and feels like her lymph nodes are swollen. States all 3 of her children are sick. Her symptoms started about 3 days ago. No fever

## 2022-04-01 ENCOUNTER — Other Ambulatory Visit: Payer: Self-pay

## 2022-04-01 ENCOUNTER — Ambulatory Visit
Admission: EM | Admit: 2022-04-01 | Discharge: 2022-04-01 | Disposition: A | Discharge status: Home / Self Care | Payer: BC Managed Care – PPO | Attending: Family Medicine | Admitting: Family Medicine

## 2022-04-01 ENCOUNTER — Encounter: Payer: Self-pay | Admitting: Emergency Medicine

## 2022-04-01 DIAGNOSIS — J0100 Acute maxillary sinusitis, unspecified: Secondary | ICD-10-CM

## 2022-04-01 DIAGNOSIS — J3489 Other specified disorders of nose and nasal sinuses: Secondary | ICD-10-CM

## 2022-04-01 MED ORDER — PREDNISONE 20 MG PO TABS: ORAL_TABLET | ORAL | 0 refills | Status: DC

## 2022-04-01 MED ORDER — AMOXICILLIN-POT CLAVULANATE 875-125 MG PO TABS: 1.0000 | ORAL_TABLET | Freq: Two times a day (BID) | ORAL | 0 refills | Status: DC

## 2022-04-01 NOTE — ED Provider Notes (Signed)
[facility]    CSN: [id number] Arrival date & time: 04/01/22  1033      History   Chief Complaint Chief Complaint  Patient presents with   Facial Pain    HPI Marly Schuld is a 46 y.o. female.   HPI 46 year old female presents with sinus congestion facial pressure/pain, and green mucus x 13 days.  PMH significant for obesity, other chronic pain and rosacea.  Past Medical History:  Diagnosis Date   Anxiety    Fatty liver    Nonspecific elevation of levels of transaminase and lactic acid dehydrogenase (LDH)    Other chronic pain    Overweight    Rosacea    Unspecified vitamin D deficiency     Patient Active Problem List   Diagnosis Date Noted   Piriformis syndrome of right side 05/11/2014   SI (sacroiliac) joint dysfunction 07/30/2013   Adjustment disorder with mixed anxiety and depressed mood 07/15/2013   Other malaise and fatigue 07/15/2013   Back pain 07/14/2013   Nonallopathic lesion of cervical region 07/14/2013   Nonallopathic lesion of thoracic region 07/14/2013   Nonallopathic lesion of lumbosacral region 07/14/2013   Backache 04/28/2013   Overweight (BMI 25.0-29.9) 04/08/2013   Need for prophylactic vaccination and inoculation against influenza 03/22/2013   Fatty liver disease, nonalcoholic 11/29/2012   DEPRESSION 02/18/2010    Past Surgical History:  Procedure Laterality Date   CESAREAN SECTION     CHOLECYSTECTOMY     WISDOM TOOTH EXTRACTION      OB History   No obstetric history on file.      Home Medications    Prior to Admission medications   Medication Sig Start Date End Date Taking? Authorizing Provider  amoxicillin-clavulanate (AUGMENTIN) 875-125 MG tablet Take 1 tablet by mouth every 12 (twelve) hours. 04/01/22  Yes Trevor Iha, FNP  predniSONE (DELTASONE) 20 MG tablet Take 3 tabs PO daily x 5 days. 04/01/22  Yes Trevor Iha, FNP  citalopram (CELEXA) 20 MG tablet Take 20 mg by mouth daily. 06/01/17    [provider]  omeprazole (PRILOSEC) 20 MG capsule TAKE 1 CAPSULE BY MOUTH EVERY DAY 07/27/18   [provider]    Family History Family History  Problem Relation Age of Onset   Diabetes Father    Hyperlipidemia Father    Hypertension Father    Cancer Other        colon cancer    Anuerysm Other    Diabetes Paternal Grandmother     Social History Social History   Tobacco Use   Smoking status: Never   Smokeless tobacco: Never  Vaping Use   Vaping Use: Never used  Substance Use Topics   Alcohol use: Yes    Comment: Rarely    Drug use: No     Allergies   Other and Influenza vaccines   Review of Systems Review of Systems  HENT:  Positive for congestion, sinus pressure and sinus pain.   All other systems reviewed and are negative.    Physical Exam Triage Vital Signs ED Triage Vitals  Enc Vitals Group     BP 04/01/22 1101 109/76     Pulse Rate 04/01/22 1101 78     Resp 04/01/22 1101 16     Temp 04/01/22 1101 99.2 F (37.3 C)     Temp Source 04/01/22 1101 Oral     SpO2 04/01/22 1101 98 %     Weight 04/01/22 1102 185 lb (83.9 kg)  Height 04/01/22 1102 5\' 8"  (1.727 m)     Head Circumference --      Peak Flow --      Pain Score 04/01/22 1102 3     Pain Loc --      Pain Edu? --      Excl. in GC? --    No data found.  Updated Vital Signs BP 109/76 (BP Location: Left Arm)   Pulse 78   Temp 99.2 F (37.3 C) (Oral)   Resp 16   Ht 5\' 8"  (1.727 m)   Wt 185 lb (83.9 kg)   SpO2 98%   BMI 28.13 kg/m       Physical Exam Vitals and nursing note reviewed.  Constitutional:      Appearance: Normal appearance. She is obese. She is ill-appearing.  HENT:     Head: Normocephalic and atraumatic.     Right Ear: Tympanic membrane and external ear normal.     Left Ear: Tympanic membrane and external ear normal.     Ears:     Comments: Significant eustachian tube dysfunction noted bilaterally    Nose:     Right Sinus: Maxillary sinus  tenderness present.     Left Sinus: Maxillary sinus tenderness present.     Comments: Turbinates are erythematous/edematous    Mouth/Throat:     Mouth: Mucous membranes are moist.     Pharynx: Oropharynx is clear.  Eyes:     Extraocular Movements: Extraocular movements intact.     Conjunctiva/sclera: Conjunctivae normal.     Pupils: Pupils are equal, round, and reactive to light.  Cardiovascular:     Rate and Rhythm: Normal rate and regular rhythm.     Pulses: Normal pulses.     Heart sounds: Normal heart sounds.  Pulmonary:     Effort: Pulmonary effort is normal.     Breath sounds: Normal breath sounds. No wheezing, rhonchi or rales.  Musculoskeletal:        General: Normal range of motion.     Cervical back: Normal range of motion and neck supple.  Skin:    General: Skin is warm and dry.  Neurological:     General: No focal deficit present.     Mental Status: She is alert and oriented to person, place, and time. Mental status is at baseline.      UC Treatments / Results  Labs (all labs ordered are listed, but only abnormal results are displayed) Labs Reviewed - No data to display  EKG   Radiology No results found.  Procedures Procedures (including critical care time)  Medications Ordered in UC Medications - No data to display  Initial Impression / Assessment and Plan / UC Course  I have reviewed the triage vital signs and the nursing notes.  Pertinent labs & imaging results that were available during my care of the patient were reviewed by me and considered in my medical decision making (see chart for details).     MDM: 1.  Subacute maxillary sinusitis-Rx'd Augmentin; 2.  Sinus pressure-Rx'd prednisone. Advised patient to take medication as directed with food to completion.  Advised patient to take prednisone with first dose of Augmentin for the next 5 of 7 days.  Encouraged patient to increase daily water intake to 64 ounces per day while taking these  medications.  Advised if symptoms worsen and/or unresolved please follow-up with PCP or here for further evaluation. Final Clinical Impressions(s) / UC Diagnoses   Final diagnoses:  Subacute maxillary sinusitis  Sinus pressure     Discharge Instructions      Advised patient to take medication as directed with food to completion.  Advised patient to take prednisone with first dose of Augmentin for the next 5 of 7 days.  Encouraged patient to increase daily water intake to 64 ounces per day while taking these medications.  Advised if symptoms worsen and/or unresolved please follow-up with PCP or here for further evaluation.     ED Prescriptions     Medication Sig Dispense Auth. Provider   amoxicillin-clavulanate (AUGMENTIN) 875-125 MG tablet Take 1 tablet by mouth every 12 (twelve) hours. 14 tablet Trevor Iha, FNP   predniSONE (DELTASONE) 20 MG tablet Take 3 tabs PO daily x 5 days. 15 tablet Trevor Iha, FNP      PDMP not reviewed this encounter.   Trevor Iha, FNP 04/01/22 1218

## 2022-04-01 NOTE — ED Triage Notes (Signed)
Sinus pain, pressure, facial pain, green mucus, congestion x 13 days. Works with kids.

## 2022-04-01 NOTE — Discharge Instructions (Addendum)
Advised patient to take medication as directed with food to completion.  Advised patient to take prednisone with first dose of Augmentin for the next 5 of 7 days.  Encouraged patient to increase daily water intake to 64 ounces per day while taking these medications.  Advised if symptoms worsen and/or unresolved please follow-up with PCP or here for further evaluation. 

## 2023-01-14 ENCOUNTER — Other Ambulatory Visit: Payer: Self-pay

## 2023-01-14 ENCOUNTER — Ambulatory Visit
Admission: RE | Admit: 2023-01-14 | Discharge: 2023-01-14 | Disposition: A | Discharge status: Home / Self Care | Payer: No Typology Code available for payment source | Source: Ambulatory Visit | Attending: Urgent Care | Admitting: Urgent Care

## 2023-01-14 VITALS — BP 124/88 | HR 82 | Temp 99.2°F | Resp 16

## 2023-01-14 DIAGNOSIS — J069 Acute upper respiratory infection, unspecified: Secondary | ICD-10-CM | POA: Diagnosis not present

## 2023-01-14 MED ORDER — AZITHROMYCIN 250 MG PO TABS: 250.0000 mg | ORAL_TABLET | Freq: Every day | ORAL | 0 refills | Status: DC

## 2023-01-14 NOTE — ED Triage Notes (Signed)
Throat sore, headache, congestion, clammy x few days

## 2023-01-14 NOTE — Discharge Instructions (Signed)
Please start taking the azithromycin tonight per package directions.  Take 2 tonight, followed by 1 for the following 4 days. Drink plenty of water. Buy over-the-counter Mucinex, the only active ingredient should be guaifenesin.  Take 600 mg twice daily Also purchase a nasal saline solution such as arm and Hammer or simply saline.  Use this to flush out your nasal sinus passages. Your COVID test is negative. You are cleared to return to work as long as you are afebrile for greater than 24 hours prior to returning

## 2023-01-14 NOTE — ED Provider Notes (Signed)
[facility]    CSN: [id number] Arrival date & time: 01/14/23  1850      History   Chief Complaint Chief Complaint  Patient presents with   Nasal Congestion    HPI Samara Stankowski is a 47 y.o. female.   Kemiyah Tarazon is a 47 y.o. female presenting with sore throat, headache, sinus and chest congestion along with clamminess that started on Friday. Patient stated that she tried tylenol, ibuprofen and pseudophed with minimal relief. Patient endorses sick contact from her brother-in-law who visited the day before her symptom onset. Patient also stated that her 16 yo daughter was dx with pneumonia yesterday. Patient endorses nausea but denies vomiting, however she stated that she has had 1-2 episodes of diarrhea earlier in the day but it has not reoccurred since.      Past Medical History:  Diagnosis Date   Anxiety    Fatty liver    Nonspecific elevation of levels of transaminase and lactic acid dehydrogenase (LDH)    Other chronic pain    Overweight    Rosacea    Unspecified vitamin D deficiency     Patient Active Problem List   Diagnosis Date Noted   Piriformis syndrome of right side 05/11/2014   SI (sacroiliac) joint dysfunction 07/30/2013   Adjustment disorder with mixed anxiety and depressed mood 07/15/2013   Other malaise and fatigue 07/15/2013   Back pain 07/14/2013   Nonallopathic lesion of cervical region 07/14/2013   Nonallopathic lesion of thoracic region 07/14/2013   Nonallopathic lesion of lumbosacral region 07/14/2013   Backache 04/28/2013   Overweight (BMI 25.0-29.9) 04/08/2013   Need for prophylactic vaccination and inoculation against influenza 03/22/2013   Fatty liver disease, nonalcoholic 11/29/2012   DEPRESSION 02/18/2010    Past Surgical History:  Procedure Laterality Date   CESAREAN SECTION     CHOLECYSTECTOMY     WISDOM TOOTH EXTRACTION      OB History   No obstetric history on file.      Home Medications     Prior to Admission medications   Medication Sig Start Date End Date Taking? Authorizing Provider  azithromycin (ZITHROMAX) 250 MG tablet Take 1 tablet (250 mg total) by mouth daily. Take first 2 tablets together, then 1 every day until finished. 01/14/23  Yes Shaheim Mahar L, PA  citalopram (CELEXA) 20 MG tablet Take 20 mg by mouth daily. 06/01/17   [provider]  omeprazole (PRILOSEC) 20 MG capsule TAKE 1 CAPSULE BY MOUTH EVERY DAY 07/27/18   [provider]    Family History Family History  Problem Relation Age of Onset   Diabetes Father    Hyperlipidemia Father    Hypertension Father    Cancer Other        colon cancer    Anuerysm Other    Diabetes Paternal Grandmother     Social History Social History   Tobacco Use   Smoking status: Never   Smokeless tobacco: Never  Vaping Use   Vaping status: Never Used  Substance Use Topics   Alcohol use: Yes    Comment: Rarely    Drug use: No     Allergies   Patient has no active allergies.   Review of Systems Review of Systems As per HPI  Physical Exam Triage Vital Signs ED Triage Vitals  Encounter Vitals Group     BP 01/14/23 1903 124/88     Systolic BP Percentile --      Diastolic BP Percentile --  Pulse Rate 01/14/23 1903 82     Resp 01/14/23 1903 16     Temp 01/14/23 1903 99.2 F (37.3 C)     Temp Source 01/14/23 1903 Oral     SpO2 01/14/23 1903 99 %     Weight --      Height --      Head Circumference --      Peak Flow --      Pain Score 01/14/23 1904 2     Pain Loc --      Pain Education --      Exclude from Growth Chart --    No data found.  Updated Vital Signs BP 124/88 (BP Location: Left Arm)   Pulse 82   Temp 99.2 F (37.3 C) (Oral)   Resp 16   SpO2 99%   Visual Acuity Right Eye Distance:   Left Eye Distance:   Bilateral Distance:    Right Eye Near:   Left Eye Near:    Bilateral Near:     Physical Exam Vitals and nursing note reviewed.  Constitutional:       General: She is not in acute distress.    Appearance: Normal appearance. She is well-developed. She is ill-appearing. She is not toxic-appearing.  HENT:     Head: Normocephalic and atraumatic.     Right Ear: Tympanic membrane, ear canal and external ear normal. There is no impacted cerumen.     Left Ear: Tympanic membrane, ear canal and external ear normal. There is no impacted cerumen.     Nose: Congestion and rhinorrhea present.     Mouth/Throat:     Mouth: Mucous membranes are moist.     Pharynx: Oropharynx is clear. Posterior oropharyngeal erythema present. No oropharyngeal exudate.  Eyes:     General: No scleral icterus.       Right eye: No discharge.        Left eye: No discharge.     Extraocular Movements: Extraocular movements intact.     Conjunctiva/sclera: Conjunctivae normal.     Pupils: Pupils are equal, round, and reactive to light.  Cardiovascular:     Rate and Rhythm: Normal rate and regular rhythm.     Heart sounds: No murmur heard. Pulmonary:     Effort: Pulmonary effort is normal. No accessory muscle usage, respiratory distress or retractions.     Breath sounds: Normal air entry. No stridor, decreased air movement or transmitted upper airway sounds. Rhonchi present. No decreased breath sounds, wheezing or rales.  Chest:     Chest wall: No tenderness.  Abdominal:     General: Abdomen is flat. Bowel sounds are normal. There is no distension.     Palpations: Abdomen is soft.     Tenderness: There is no abdominal tenderness. There is no right CVA tenderness, left CVA tenderness, guarding or rebound.  Musculoskeletal:        General: No swelling.     Cervical back: Normal range of motion and neck supple. No rigidity or tenderness.  Lymphadenopathy:     Cervical: Cervical adenopathy present.  Skin:    General: Skin is warm and dry.     Capillary Refill: Capillary refill takes less than 2 seconds.     Coloration: Skin is not jaundiced.     Findings: No bruising,  erythema or rash.  Neurological:     General: No focal deficit present.     Mental Status: She is alert and oriented to person, place, and time.  Psychiatric:        Mood and Affect: Mood normal.      UC Treatments / Results  Labs (all labs ordered are listed, but only abnormal results are displayed) Labs Reviewed  POC SARS CORONAVIRUS 2 AG -  ED    EKG   Radiology No results found.  Procedures Procedures (including critical care time)  Medications Ordered in UC Medications - No data to display  Initial Impression / Assessment and Plan / UC Course  I have reviewed the triage vital signs and the nursing notes.  Pertinent labs & imaging results that were available during my care of the patient were reviewed by me and considered in my medical decision making (see chart for details).     Acute URI - suspect bacterial, possibly mycoplasma given exposures and risk factors. Will start azithromycin dose pack, nasal saline and mucinex. Covid test negative.   Final Clinical Impressions(s) / UC Diagnoses   Final diagnoses:  Acute upper respiratory infection     Discharge Instructions      Please start taking the azithromycin tonight per package directions.  Take 2 tonight, followed by 1 for the following 4 days. Drink plenty of water. Buy over-the-counter Mucinex, the only active ingredient should be guaifenesin.  Take 600 mg twice daily Also purchase a nasal saline solution such as arm and Hammer or simply saline.  Use this to flush out your nasal sinus passages. Your COVID test is negative. You are cleared to return to work as long as you are afebrile for greater than 24 hours prior to returning     ED Prescriptions     Medication Sig Dispense Auth. Provider   azithromycin (ZITHROMAX) 250 MG tablet Take 1 tablet (250 mg total) by mouth daily. Take first 2 tablets together, then 1 every day until finished. 6 tablet Victory Strollo L, [place]      PDMP not reviewed  this encounter.   Maretta Bees, [place] 01/18/23 [phone]

## 2023-09-16 ENCOUNTER — Ambulatory Visit
Admission: RE | Admit: 2023-09-16 | Discharge: 2023-09-16 | Disposition: A | Discharge status: Home / Self Care | Source: Ambulatory Visit | Attending: Family Medicine | Admitting: Family Medicine

## 2023-09-16 VITALS — BP 107/68 | HR 76 | Temp 99.0°F | Resp 16

## 2023-09-16 DIAGNOSIS — R059 Cough, unspecified: Secondary | ICD-10-CM | POA: Diagnosis not present

## 2023-09-16 DIAGNOSIS — J069 Acute upper respiratory infection, unspecified: Secondary | ICD-10-CM

## 2023-09-16 MED ORDER — PREDNISONE 20 MG PO TABS: ORAL_TABLET | ORAL | 0 refills | Status: AC

## 2023-09-16 MED ORDER — AMOXICILLIN-POT CLAVULANATE 875-125 MG PO TABS: 1.0000 | ORAL_TABLET | Freq: Two times a day (BID) | ORAL | 0 refills | Status: AC

## 2023-09-16 MED ORDER — HYDROCODONE BIT-HOMATROP MBR 5-1.5 MG/5ML PO SOLN: 5.0000 mL | Freq: Four times a day (QID) | ORAL | 0 refills | Status: AC | PRN

## 2023-09-16 NOTE — ED Provider Notes (Signed)
 [facility]    CSN: [id number] Arrival date & time: 09/16/23  1556      History   Chief Complaint Chief Complaint  Patient presents with   Cough    Entered by patient    HPI Yolanda Cooper is a 49 y.o. female.   HPI Very pleasant 48 year old female presents with cough and chest congestion for 3 weeks.  PMH significant for obesity, anxiety, other malaise and fatigue.  Past Medical History:  Diagnosis Date   Anxiety    Fatty liver    Nonspecific elevation of levels of transaminase and lactic acid dehydrogenase (LDH)    Other chronic pain    Overweight    Rosacea    Unspecified vitamin D  deficiency     Patient Active Problem List   Diagnosis Date Noted   Piriformis syndrome of right side 05/11/2014   SI (sacroiliac) joint dysfunction 07/30/2013   Adjustment disorder with mixed anxiety and depressed mood 07/15/2013   Other malaise and fatigue 07/15/2013   Back pain 07/14/2013   Nonallopathic lesion of cervical region 07/14/2013   Nonallopathic lesion of thoracic region 07/14/2013   Nonallopathic lesion of lumbosacral region 07/14/2013   Backache 04/28/2013   Overweight (BMI 25.0-29.9) 04/08/2013   Need for prophylactic vaccination and inoculation against influenza 03/22/2013   Fatty liver disease, nonalcoholic 11/29/2012   DEPRESSION 02/18/2010    Past Surgical History:  Procedure Laterality Date   CESAREAN SECTION     CHOLECYSTECTOMY     WISDOM TOOTH EXTRACTION      OB History   No obstetric history on file.      Home Medications    Prior to Admission medications   Medication Sig Start Date End Date Taking? Authorizing Provider  amoxicillin -clavulanate (AUGMENTIN ) 875-125 MG tablet Take 1 tablet by mouth every 12 (twelve) hours. 09/16/23  Yes Leonides Ramp, FNP  HYDROcodone  bit-homatropine (HYCODAN) 5-1.5 MG/5ML syrup Take 5 mLs by mouth every 6 (six) hours as needed for cough. 09/16/23  Yes Leonides Ramp, FNP  predniSONE   (DELTASONE ) 20 MG tablet Take 3 tabs PO daily x 5 days. 09/16/23  Yes Leonides Ramp, FNP  citalopram  (CELEXA ) 20 MG tablet Take 20 mg by mouth daily. 06/01/17   [provider]  omeprazole (PRILOSEC) 20 MG capsule TAKE 1 CAPSULE BY MOUTH EVERY DAY 07/27/18   [provider]    Family History Family History  Problem Relation Age of Onset   Diabetes Father    Hyperlipidemia Father    Hypertension Father    Cancer Other        colon cancer    Anuerysm Other    Diabetes Paternal Grandmother     Social History Social History   Tobacco Use   Smoking status: Never   Smokeless tobacco: Never  Vaping Use   Vaping status: Never Used  Substance Use Topics   Alcohol use: Yes    Comment: Rarely    Drug use: No     Allergies   Patient has no known allergies.   Review of Systems Review of Systems  Respiratory:  Positive for cough.        Chest congestion x 3 weeks     Physical Exam Triage Vital Signs ED Triage Vitals  Encounter Vitals Group     BP      Systolic BP Percentile      Diastolic BP Percentile      Pulse      Resp      Temp  Temp src      SpO2      Weight      Height      Head Circumference      Peak Flow      Pain Score      Pain Loc      Pain Education      Exclude from Growth Chart    No data found.  Updated Vital Signs BP 107/68   Pulse 76   Temp 99 F (37.2 C)   Resp 16   SpO2 98%    Physical Exam Vitals and nursing note reviewed.  Constitutional:      Appearance: Normal appearance. She is normal weight. She is ill-appearing.  HENT:     Head: Normocephalic and atraumatic.     Right Ear: Tympanic membrane, ear canal and external ear normal.     Left Ear: Tympanic membrane, ear canal and external ear normal.     Mouth/Throat:     Mouth: Mucous membranes are moist.     Pharynx: Oropharynx is clear.  Eyes:     Extraocular Movements: Extraocular movements intact.     Conjunctiva/sclera: Conjunctivae normal.      Pupils: Pupils are equal, round, and reactive to light.  Cardiovascular:     Rate and Rhythm: Normal rate and regular rhythm.     Pulses: Normal pulses.     Heart sounds: Normal heart sounds.  Pulmonary:     Effort: Pulmonary effort is normal.     Breath sounds: Normal breath sounds. No wheezing, rhonchi or rales.     Comments: Frequent nonproductive cough on exam Chest:     Chest wall: No tenderness.  Musculoskeletal:        General: Normal range of motion.     Cervical back: Normal range of motion and neck supple.  Skin:    General: Skin is warm and dry.  Neurological:     General: No focal deficit present.     Mental Status: She is alert and oriented to person, place, and time. Mental status is at baseline.  Psychiatric:        Mood and Affect: Mood normal.        Behavior: Behavior normal.      UC Treatments / Results  Labs (all labs ordered are listed, but only abnormal results are displayed) Labs Reviewed - No data to display  EKG   Radiology No results found.  Procedures Procedures (including critical care time)  Medications Ordered in UC Medications - No data to display  Initial Impression / Assessment and Plan / UC Course  I have reviewed the triage vital signs and the nursing notes.  Pertinent labs & imaging results that were available during my care of the patient were reviewed by me and considered in my medical decision making (see chart for details).     MDM: 1.  Acute URI-Rx'd Augmentin  875/125 mg tablet: Take 1 tablet twice daily x 7 days; 2.  Cough, unspecified type-Rx'd prednisone  20 mg tablet: Take 3 tablets p.o. daily x 5 days, Rx'd Hycodan 5-1.5 mg / 5 mL syrup: Take 5 mL every 6 hours, as needed for cough. Advised patient to take medications as directed with food to completion.  Advised patient to take prednisone  with first dose of Augmentin  for the next 5 of 7 days.  Advised may use Hycodan at night for cough prior to sleep due to sedative  effects.  Encouraged to increase daily water intake to 64  ounces per day while taking these medications.  Advised if symptoms worsen and/or unresolved please follow-up with your PCP or here for further evaluation.  Patient discharged home, hemodynamically stable. Final Clinical Impressions(s) / UC Diagnoses   Final diagnoses:  Cough, unspecified type  Acute URI     Discharge Instructions      Advised patient to take medications as directed with food to completion.  Advised patient to take prednisone  with first dose of Augmentin  for the next 5 of 7 days.  Advised may use Hycodan at night for cough prior to sleep due to sedative effects.  Encouraged to increase daily water intake to 64 ounces per day while taking these medications.  Advised if symptoms worsen and/or unresolved please follow-up with your PCP or here for further evaluation.   ED Prescriptions     Medication Sig Dispense Auth. Provider   amoxicillin -clavulanate (AUGMENTIN ) 875-125 MG tablet Take 1 tablet by mouth every 12 (twelve) hours. 14 tablet Quantavia Frith, FNP   predniSONE  (DELTASONE ) 20 MG tablet Take 3 tabs PO daily x 5 days. 15 tablet Kennieth Plotts, FNP   HYDROcodone  bit-homatropine (HYCODAN) 5-1.5 MG/5ML syrup Take 5 mLs by mouth every 6 (six) hours as needed for cough. 120 mL Leonides Ramp, FNP      I have reviewed the PDMP during this encounter.   Leonides Ramp, FNP 09/16/23 1656

## 2023-09-16 NOTE — Discharge Instructions (Addendum)
 Advised patient to take medications as directed with food to completion.  Advised patient to take prednisone  with first dose of Augmentin  for the next 5 of 7 days.  Advised may use Hycodan at night for cough prior to sleep due to sedative effects.  Encouraged to increase daily water intake to 64 ounces per day while taking these medications.  Advised if symptoms worsen and/or unresolved please follow-up with your PCP or here for further evaluation.

## 2023-09-16 NOTE — ED Triage Notes (Signed)
 Pt presents to [facility] with co cough for 3 weeks with new onset of chest congestion and burning sensation. She has been taking musinex and nyquill.

## 2023-11-19 ENCOUNTER — Ambulatory Visit: Payer: Self-pay

## 2023-11-19 NOTE — Telephone Encounter (Signed)
 FYI Only or Action Required?: FYI only for provider.: referred pt to urgent care.  Patient was last seen in primary care on .n/a  Called Nurse Triage reporting Headache.  Symptoms began ongoing and worsening.  Interventions attempted: Nothing.  Symptoms are: gradually worsening.  Triage Disposition: See Physician Within 24 Hours: referred to urgent care.  Patient/caregiver understands and will follow disposition?: Yes     Copied from CRM [id number]. Topic: Clinical - Red Word Triage >> Nov 19, 2023  4:14 PM Brittney F wrote: [facility] that prompted transfer to Nurse Triage:   Dizziness; intense headaches Reason for Disposition  [1] MODERATE headache (e.g., interferes with normal activities) AND [2] present > 24 hours AND [3] unexplained  (Exceptions: Pain medicines not tried, typical migraine, or headache part of viral illness.)    Referred patient to urgent care  Answer Assessment - Initial Assessment Questions 1. LOCATION: Where does it hurt?      B/w eyes, behind neck, across cheeks, across eyebrow bone 2. ONSET: When did the headache start? (e.g., minutes, hours, days)      Ongoing and worsening 3. PATTERN: Does the pain come and go, or has it been constant since it started?     Comes and goes 4. SEVERITY: How bad is the pain? and What does it keep you from doing?  (e.g., Scale 1-10; mild, moderate, or severe)     8/10 5. RECURRENT SYMPTOM: Have you ever had headaches before? If Yes, ask: When was the last time? and What happened that time?      no 6. CAUSE: What do you think is causing the headache?     Unknown  7. MIGRAINE: Have you been diagnosed with migraine headaches? If Yes, ask: Is this headache similar?      Yes but in the past - this only occurred about every 6 months  and now its every other day 8. HEAD INJURY: Has there been any recent injury to your head?      no 9. OTHER SYMPTOMS: Do you have any other symptoms? (e.g., fever, stiff  neck, eye pain, sore throat, cold symptoms)     Stiff neck a little in back, dizziness, generally feeling weak all over 10. PREGNANCY: Is there any chance you are pregnant? When was your last menstrual period?  Protocols used: [facility]
